# Patient Record
Sex: Male | Born: 1950 | Race: White | Hispanic: No | Marital: Married | Smoking: Former smoker
Health system: Southern US, Community
[De-identification: ages and names within clinical notes are randomized; demographics above are authoritative.]

## PROBLEM LIST (undated history)

## (undated) DIAGNOSIS — J189 Pneumonia, unspecified organism: Secondary | ICD-10-CM

## (undated) DIAGNOSIS — Z87891 Personal history of nicotine dependence: Secondary | ICD-10-CM

## (undated) DIAGNOSIS — K5792 Diverticulitis of intestine, part unspecified, without perforation or abscess without bleeding: Secondary | ICD-10-CM

## (undated) DIAGNOSIS — L03311 Cellulitis of abdominal wall: Secondary | ICD-10-CM

## (undated) DIAGNOSIS — M1990 Unspecified osteoarthritis, unspecified site: Secondary | ICD-10-CM

## (undated) DIAGNOSIS — J101 Influenza due to other identified influenza virus with other respiratory manifestations: Secondary | ICD-10-CM

## (undated) DIAGNOSIS — I10 Essential (primary) hypertension: Secondary | ICD-10-CM

## (undated) DIAGNOSIS — Z951 Presence of aortocoronary bypass graft: Secondary | ICD-10-CM

## (undated) DIAGNOSIS — IMO0002 Reserved for concepts with insufficient information to code with codable children: Secondary | ICD-10-CM

## (undated) DIAGNOSIS — T8859XA Other complications of anesthesia, initial encounter: Secondary | ICD-10-CM

## (undated) DIAGNOSIS — J449 Chronic obstructive pulmonary disease, unspecified: Secondary | ICD-10-CM

## (undated) DIAGNOSIS — T4145XA Adverse effect of unspecified anesthetic, initial encounter: Secondary | ICD-10-CM

## (undated) DIAGNOSIS — R229 Localized swelling, mass and lump, unspecified: Secondary | ICD-10-CM

## (undated) DIAGNOSIS — I2510 Atherosclerotic heart disease of native coronary artery without angina pectoris: Secondary | ICD-10-CM

## (undated) DIAGNOSIS — A4902 Methicillin resistant Staphylococcus aureus infection, unspecified site: Secondary | ICD-10-CM

## (undated) HISTORY — DX: Atherosclerotic heart disease of native coronary artery without angina pectoris: I25.10

## (undated) HISTORY — DX: Unspecified osteoarthritis, unspecified site: M19.90

## (undated) HISTORY — DX: Cellulitis of abdominal wall: L03.311

## (undated) HISTORY — PX: CORONARY ARTERY BYPASS GRAFT: SHX141

## (undated) HISTORY — DX: Localized swelling, mass and lump, unspecified: R22.9

## (undated) HISTORY — DX: Methicillin resistant Staphylococcus aureus infection, unspecified site: A49.02

## (undated) HISTORY — DX: Pneumonia, unspecified organism: J18.9

## (undated) HISTORY — PX: HERNIA REPAIR: SHX51

## (undated) HISTORY — PX: COLOSTOMY: SHX63

## (undated) HISTORY — PX: COLECTOMY: SHX59

## (undated) HISTORY — DX: Influenza due to other identified influenza virus with other respiratory manifestations: J10.1

## (undated) HISTORY — DX: Reserved for concepts with insufficient information to code with codable children: IMO0002

## (undated) HISTORY — DX: Diverticulitis of intestine, part unspecified, without perforation or abscess without bleeding: K57.92

## (undated) HISTORY — PX: COLOSTOMY TAKEDOWN: SHX5258

---

## 2002-02-18 ENCOUNTER — Inpatient Hospital Stay (HOSPITAL_COMMUNITY): Admission: EM | Admit: 2002-02-18 | Discharge: 2002-02-24 | Payer: Self-pay | Admitting: Emergency Medicine

## 2002-02-18 ENCOUNTER — Encounter: Payer: Self-pay | Admitting: Cardiothoracic Surgery

## 2002-02-18 ENCOUNTER — Encounter: Payer: Self-pay | Admitting: Emergency Medicine

## 2002-02-18 ENCOUNTER — Encounter: Payer: Self-pay | Admitting: Cardiovascular Disease

## 2002-02-19 ENCOUNTER — Encounter: Payer: Self-pay | Admitting: Cardiothoracic Surgery

## 2002-02-20 ENCOUNTER — Encounter: Payer: Self-pay | Admitting: Cardiothoracic Surgery

## 2002-02-21 ENCOUNTER — Encounter: Payer: Self-pay | Admitting: Cardiothoracic Surgery

## 2002-02-22 ENCOUNTER — Encounter: Payer: Self-pay | Admitting: Cardiothoracic Surgery

## 2002-02-23 ENCOUNTER — Encounter: Payer: Self-pay | Admitting: Cardiothoracic Surgery

## 2002-03-18 ENCOUNTER — Encounter: Payer: Self-pay | Admitting: Cardiothoracic Surgery

## 2002-03-18 ENCOUNTER — Encounter: Admission: RE | Admit: 2002-03-18 | Discharge: 2002-03-18 | Payer: Self-pay | Admitting: Cardiothoracic Surgery

## 2002-09-22 ENCOUNTER — Encounter: Payer: Self-pay | Admitting: General Surgery

## 2002-09-22 ENCOUNTER — Inpatient Hospital Stay (HOSPITAL_COMMUNITY): Admission: EM | Admit: 2002-09-22 | Discharge: 2002-10-01 | Payer: Self-pay | Admitting: Emergency Medicine

## 2002-09-22 ENCOUNTER — Encounter: Payer: Self-pay | Admitting: Emergency Medicine

## 2002-09-23 ENCOUNTER — Encounter (INDEPENDENT_AMBULATORY_CARE_PROVIDER_SITE_OTHER): Payer: Self-pay | Admitting: *Deleted

## 2002-09-23 ENCOUNTER — Encounter: Payer: Self-pay | Admitting: Cardiovascular Disease

## 2002-12-03 ENCOUNTER — Encounter: Admission: RE | Admit: 2002-12-03 | Discharge: 2002-12-03 | Payer: Self-pay | Admitting: General Surgery

## 2003-01-10 ENCOUNTER — Inpatient Hospital Stay (HOSPITAL_COMMUNITY): Admission: RE | Admit: 2003-01-10 | Discharge: 2003-01-17 | Payer: Self-pay | Admitting: General Surgery

## 2003-01-10 ENCOUNTER — Encounter (INDEPENDENT_AMBULATORY_CARE_PROVIDER_SITE_OTHER): Payer: Self-pay

## 2004-02-06 ENCOUNTER — Ambulatory Visit: Payer: Self-pay | Admitting: Family Medicine

## 2004-02-09 ENCOUNTER — Ambulatory Visit: Payer: Self-pay | Admitting: Family Medicine

## 2004-02-16 ENCOUNTER — Ambulatory Visit: Payer: Self-pay | Admitting: Family Medicine

## 2004-05-15 ENCOUNTER — Ambulatory Visit: Payer: Self-pay | Admitting: Family Medicine

## 2004-07-25 ENCOUNTER — Ambulatory Visit: Payer: Self-pay | Admitting: Family Medicine

## 2004-09-12 ENCOUNTER — Inpatient Hospital Stay (HOSPITAL_COMMUNITY): Admission: RE | Admit: 2004-09-12 | Discharge: 2004-09-25 | Payer: Self-pay | Admitting: General Surgery

## 2004-11-26 ENCOUNTER — Ambulatory Visit: Payer: Self-pay | Admitting: Family Medicine

## 2005-03-26 ENCOUNTER — Ambulatory Visit: Payer: Self-pay | Admitting: Family Medicine

## 2005-09-09 ENCOUNTER — Encounter: Admission: RE | Admit: 2005-09-09 | Discharge: 2005-09-09 | Payer: Self-pay | Admitting: General Surgery

## 2005-09-25 ENCOUNTER — Ambulatory Visit: Payer: Self-pay | Admitting: Family Medicine

## 2006-01-20 ENCOUNTER — Ambulatory Visit: Payer: Self-pay | Admitting: Family Medicine

## 2006-05-28 ENCOUNTER — Ambulatory Visit: Payer: Self-pay | Admitting: Family Medicine

## 2006-06-18 ENCOUNTER — Ambulatory Visit: Payer: Self-pay | Admitting: Family Medicine

## 2006-12-08 ENCOUNTER — Encounter: Admission: RE | Admit: 2006-12-08 | Discharge: 2006-12-08 | Payer: Self-pay | Admitting: General Surgery

## 2006-12-10 ENCOUNTER — Ambulatory Visit (HOSPITAL_COMMUNITY): Admission: RE | Admit: 2006-12-10 | Discharge: 2006-12-10 | Payer: Self-pay | Admitting: General Surgery

## 2006-12-19 ENCOUNTER — Ambulatory Visit (HOSPITAL_COMMUNITY): Admission: RE | Admit: 2006-12-19 | Discharge: 2006-12-19 | Payer: Self-pay | Admitting: General Surgery

## 2006-12-20 ENCOUNTER — Ambulatory Visit (HOSPITAL_COMMUNITY): Admission: RE | Admit: 2006-12-20 | Discharge: 2006-12-20 | Payer: Self-pay | Admitting: General Surgery

## 2007-09-24 ENCOUNTER — Encounter: Admission: RE | Admit: 2007-09-24 | Discharge: 2007-09-24 | Payer: Self-pay | Admitting: General Surgery

## 2007-10-01 ENCOUNTER — Ambulatory Visit (HOSPITAL_COMMUNITY): Admission: RE | Admit: 2007-10-01 | Discharge: 2007-10-01 | Payer: Self-pay | Admitting: General Surgery

## 2007-10-07 ENCOUNTER — Encounter: Admission: RE | Admit: 2007-10-07 | Discharge: 2007-10-07 | Payer: Self-pay | Admitting: Interventional Radiology

## 2008-04-27 ENCOUNTER — Encounter: Admission: RE | Admit: 2008-04-27 | Discharge: 2008-04-27 | Payer: Self-pay | Admitting: General Surgery

## 2008-04-29 ENCOUNTER — Inpatient Hospital Stay (HOSPITAL_COMMUNITY): Admission: AD | Admit: 2008-04-29 | Discharge: 2008-06-10 | Payer: Self-pay | Admitting: General Surgery

## 2008-04-29 ENCOUNTER — Ambulatory Visit: Payer: Self-pay | Admitting: Internal Medicine

## 2008-05-14 HISTORY — PX: BOWEL RESECTION: SHX1257

## 2008-05-25 ENCOUNTER — Ambulatory Visit: Payer: Self-pay | Admitting: Infectious Diseases

## 2008-05-26 ENCOUNTER — Encounter (INDEPENDENT_AMBULATORY_CARE_PROVIDER_SITE_OTHER): Payer: Self-pay | Admitting: General Surgery

## 2008-05-26 ENCOUNTER — Ambulatory Visit: Payer: Self-pay | Admitting: Vascular Surgery

## 2008-06-03 ENCOUNTER — Encounter (INDEPENDENT_AMBULATORY_CARE_PROVIDER_SITE_OTHER): Payer: Self-pay | Admitting: General Surgery

## 2008-09-06 ENCOUNTER — Encounter: Admission: RE | Admit: 2008-09-06 | Discharge: 2008-09-06 | Payer: Self-pay | Admitting: General Surgery

## 2008-11-04 ENCOUNTER — Emergency Department (HOSPITAL_COMMUNITY): Admission: EM | Admit: 2008-11-04 | Discharge: 2008-11-04 | Payer: Self-pay | Admitting: Emergency Medicine

## 2008-11-04 ENCOUNTER — Encounter: Admission: RE | Admit: 2008-11-04 | Discharge: 2008-11-04 | Payer: Self-pay | Admitting: Surgery

## 2009-03-16 HISTORY — PX: ENTEROCUTANEOUS FISTULA CLOSURE: SHX1510

## 2009-06-29 DIAGNOSIS — J189 Pneumonia, unspecified organism: Secondary | ICD-10-CM

## 2009-06-29 HISTORY — DX: Pneumonia, unspecified organism: J18.9

## 2010-03-13 ENCOUNTER — Ambulatory Visit (HOSPITAL_COMMUNITY)
Admission: RE | Admit: 2010-03-13 | Discharge: 2010-03-13 | Disposition: A | Discharge status: Home / Self Care | Payer: BC Managed Care – PPO | Source: Ambulatory Visit | Attending: Family Medicine | Admitting: Family Medicine

## 2010-03-13 DIAGNOSIS — I252 Old myocardial infarction: Secondary | ICD-10-CM | POA: Insufficient documentation

## 2010-03-13 DIAGNOSIS — I10 Essential (primary) hypertension: Secondary | ICD-10-CM | POA: Insufficient documentation

## 2010-03-13 DIAGNOSIS — R609 Edema, unspecified: Secondary | ICD-10-CM

## 2010-03-13 DIAGNOSIS — M7989 Other specified soft tissue disorders: Secondary | ICD-10-CM | POA: Insufficient documentation

## 2010-03-13 DIAGNOSIS — I2510 Atherosclerotic heart disease of native coronary artery without angina pectoris: Secondary | ICD-10-CM | POA: Insufficient documentation

## 2010-04-04 IMAGING — CT CT PELVIS W/O CM
2 of 4 series · 14 of 32 positions shown, 19 images · non-contrast
Comparison: 04/27/2008

 CT ABDOMEN

May 02, 2008 –DUPLICATE COPY for exam association in [facility]. No change from original report.
CLINICAL DATA: Abdominal wall abscess and sepsis

 CT ABDOMEN AND PELVIS WITHOUT CONTRAST
TECHNIQUE: Multidetector CT imaging of the abdomen and pelvis was
 performed following the standard protocol without intravenous
 contrast.

[Series 2: abd pelvis · axial · 0.92mm/px · z∈[-460,-80]mm · 7 of 102 slices shown, 12 images]
[im 13/102  soft-tissue]
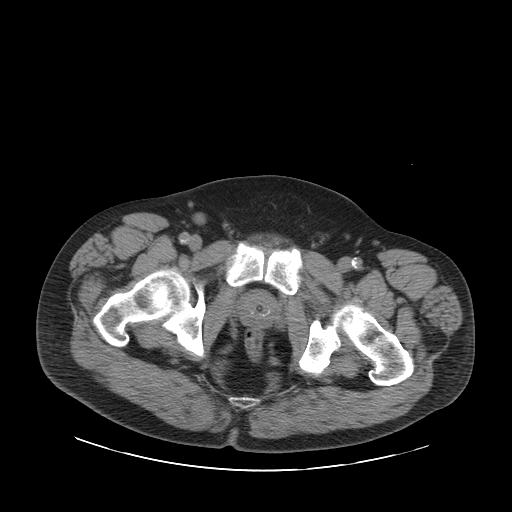
[im 13/102  bone]
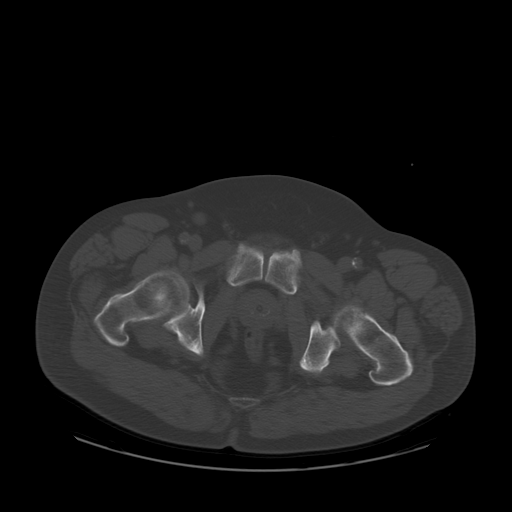
[im 26/102  soft-tissue]
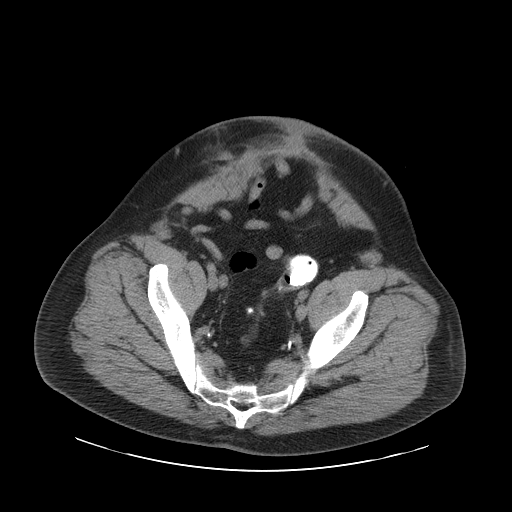
[im 38/102  soft-tissue]
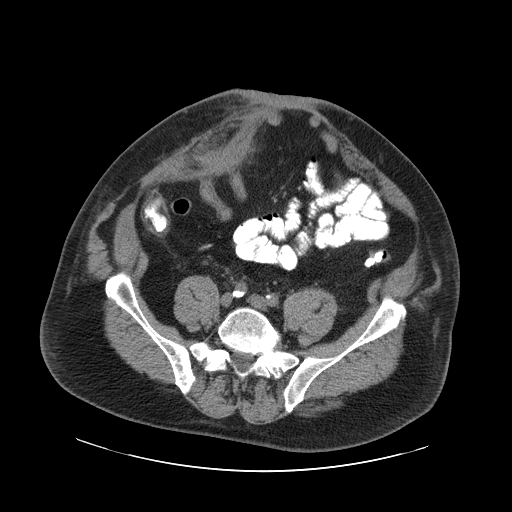
[im 51/102  soft-tissue]
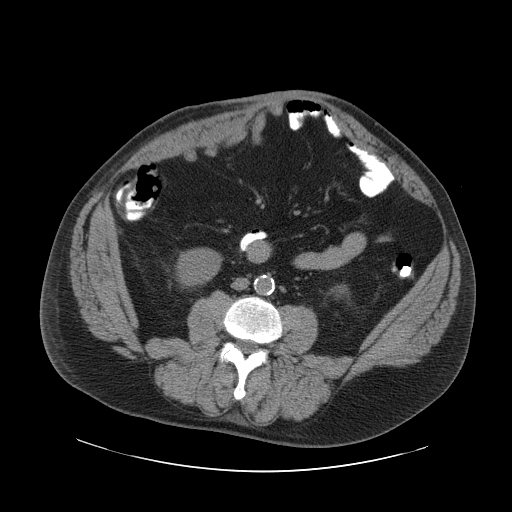
[im 51/102  lung]
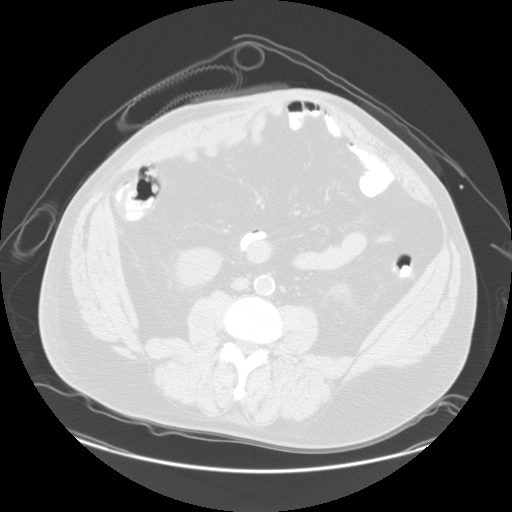
[im 64/102  soft-tissue]
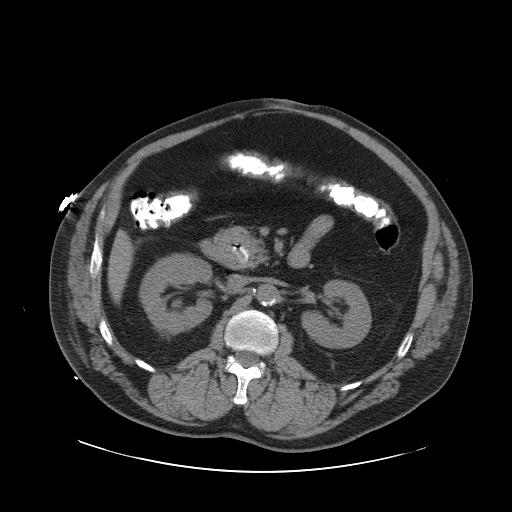
[im 64/102  lung]
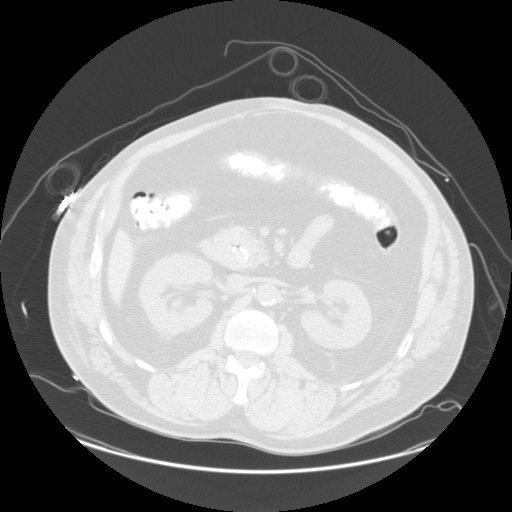
[im 76/102  soft-tissue]
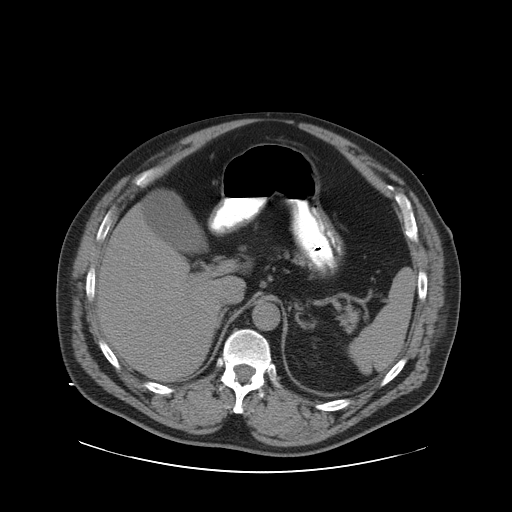
[im 76/102  lung]
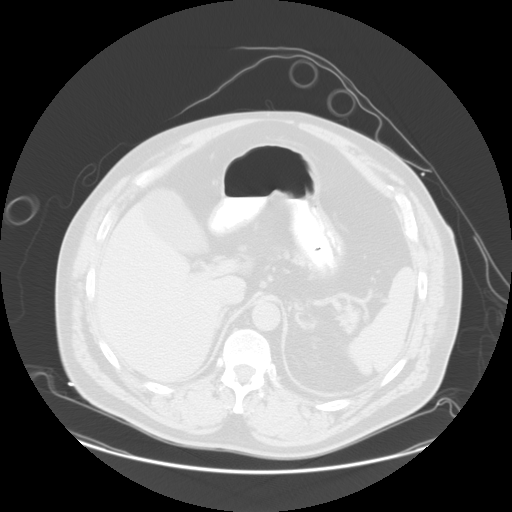
[im 89/102  soft-tissue]
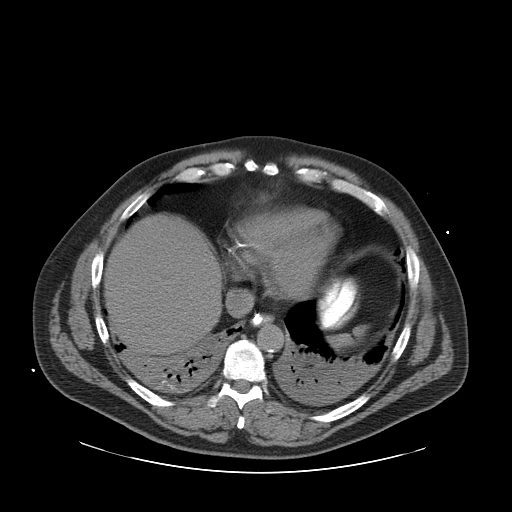
[im 89/102  lung]
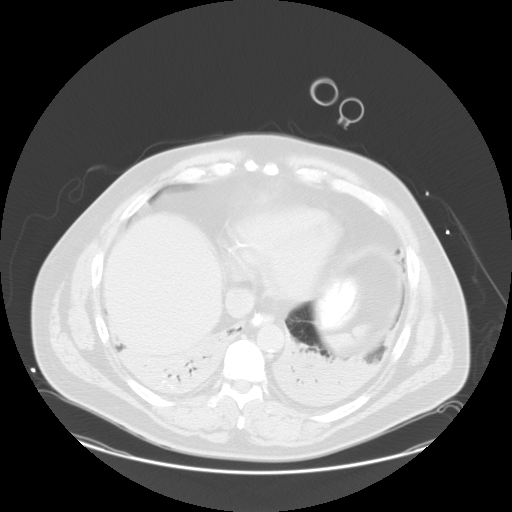

[Series 400: sag · sagittal · 1.00mm/px · 7 of 139 slices shown]
[im 13/139  soft-tissue]
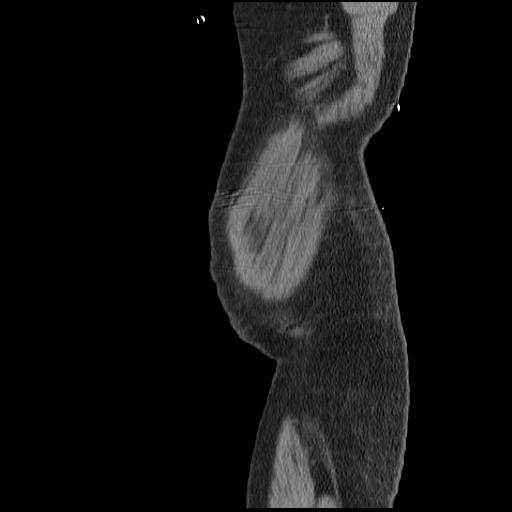
[im 26/139  soft-tissue]
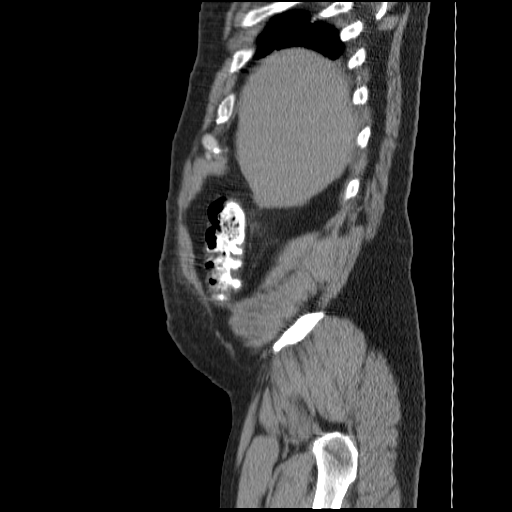
[im 51/139  soft-tissue]
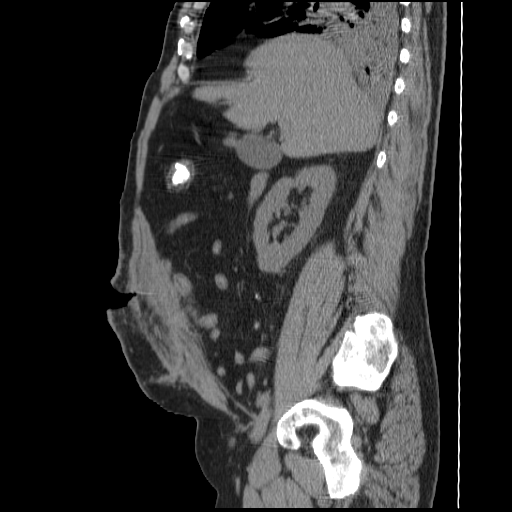
[im 63/139  soft-tissue]
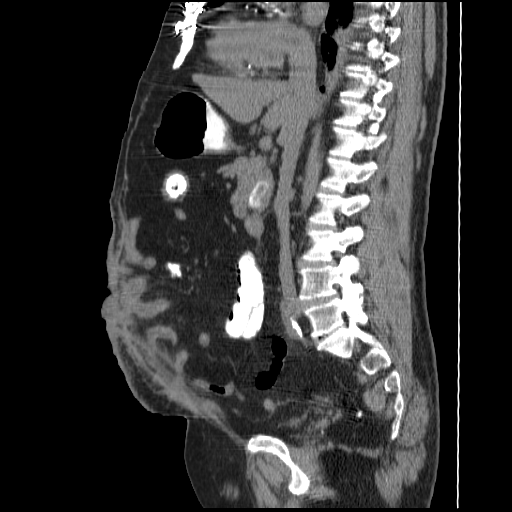
[im 76/139  soft-tissue]
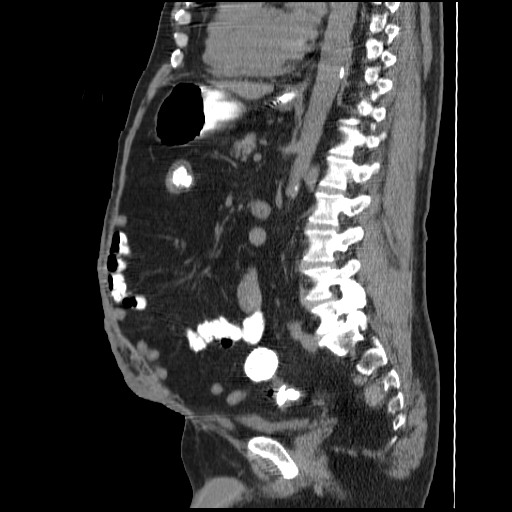
[im 88/139  soft-tissue]
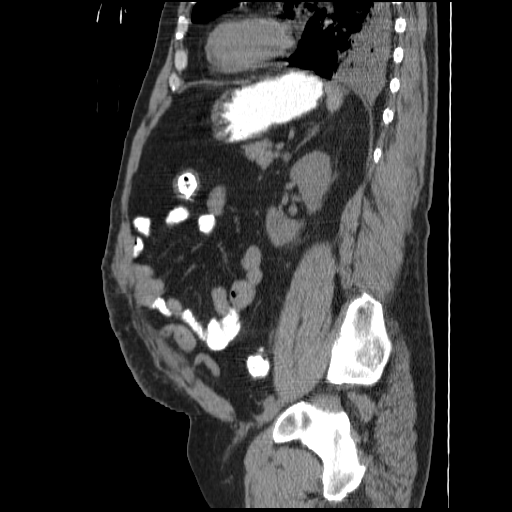
[im 113/139  soft-tissue]
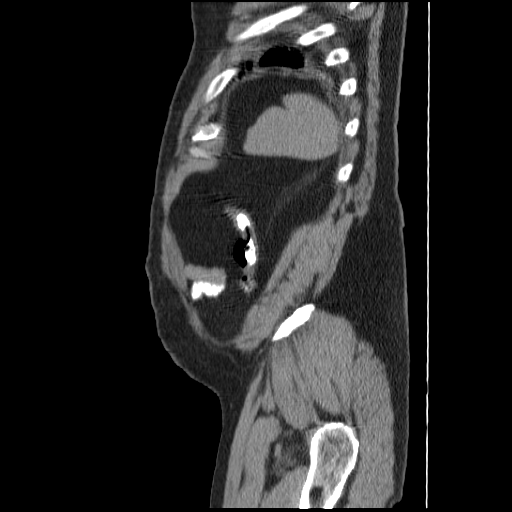

[14 of 32 positions shown; findings below may reference images not displayed]

FINDINGS: Extensive bilateral airspace disease has developed at
 both lung bases and ARDS pattern. High density is present at the
 right lung base worrisome for minimal aspiration of contrast.

 Postoperative changes in the right anterior lower abdominal wall
 are now present. The superficial fluid collection has been
 resolved. The fluid collection just deep to the anterior abdominal
 wall has been nearly completely decompressed with minimal residual
 fluid. In the abdomen, there is no free intraperitoneal air. No
 free fluid. Normal appearing small bowel loops are visualized.
 Density in the region of the pancreatic head is likely due to a
 contrast filled duodenal diverticulum.

 The gallbladder, liver, spleen, adrenal glands, and kidneys are
 stable in appearance.
IMPRESSION: Postoperative changes in the right anterior abdominal wall with
 decompression of the to abscess sees as described. The deeper
 abscess is almost completely decompressed.

 No free intraperitoneal air or free fluid in the abdomen to suggest
 bowel injury.

 Bilateral airspace disease in an ARDS pattern with severity.

 CT PELVIS
FINDINGS: No free fluid. Foley catheter decompresses the bladder.
 Normal appendix.
IMPRESSION: No acute intrapelvic pathology.

## 2010-04-06 IMAGING — CR DG CHEST 1V PORT
1 series · 1 of 1 positions shown · non-contrast
Comparison: Chest 04/30/2008.

CLINICAL DATA: Abdominal wall abscess.

PORTABLE CHEST - 1 VIEW

[view not recorded]
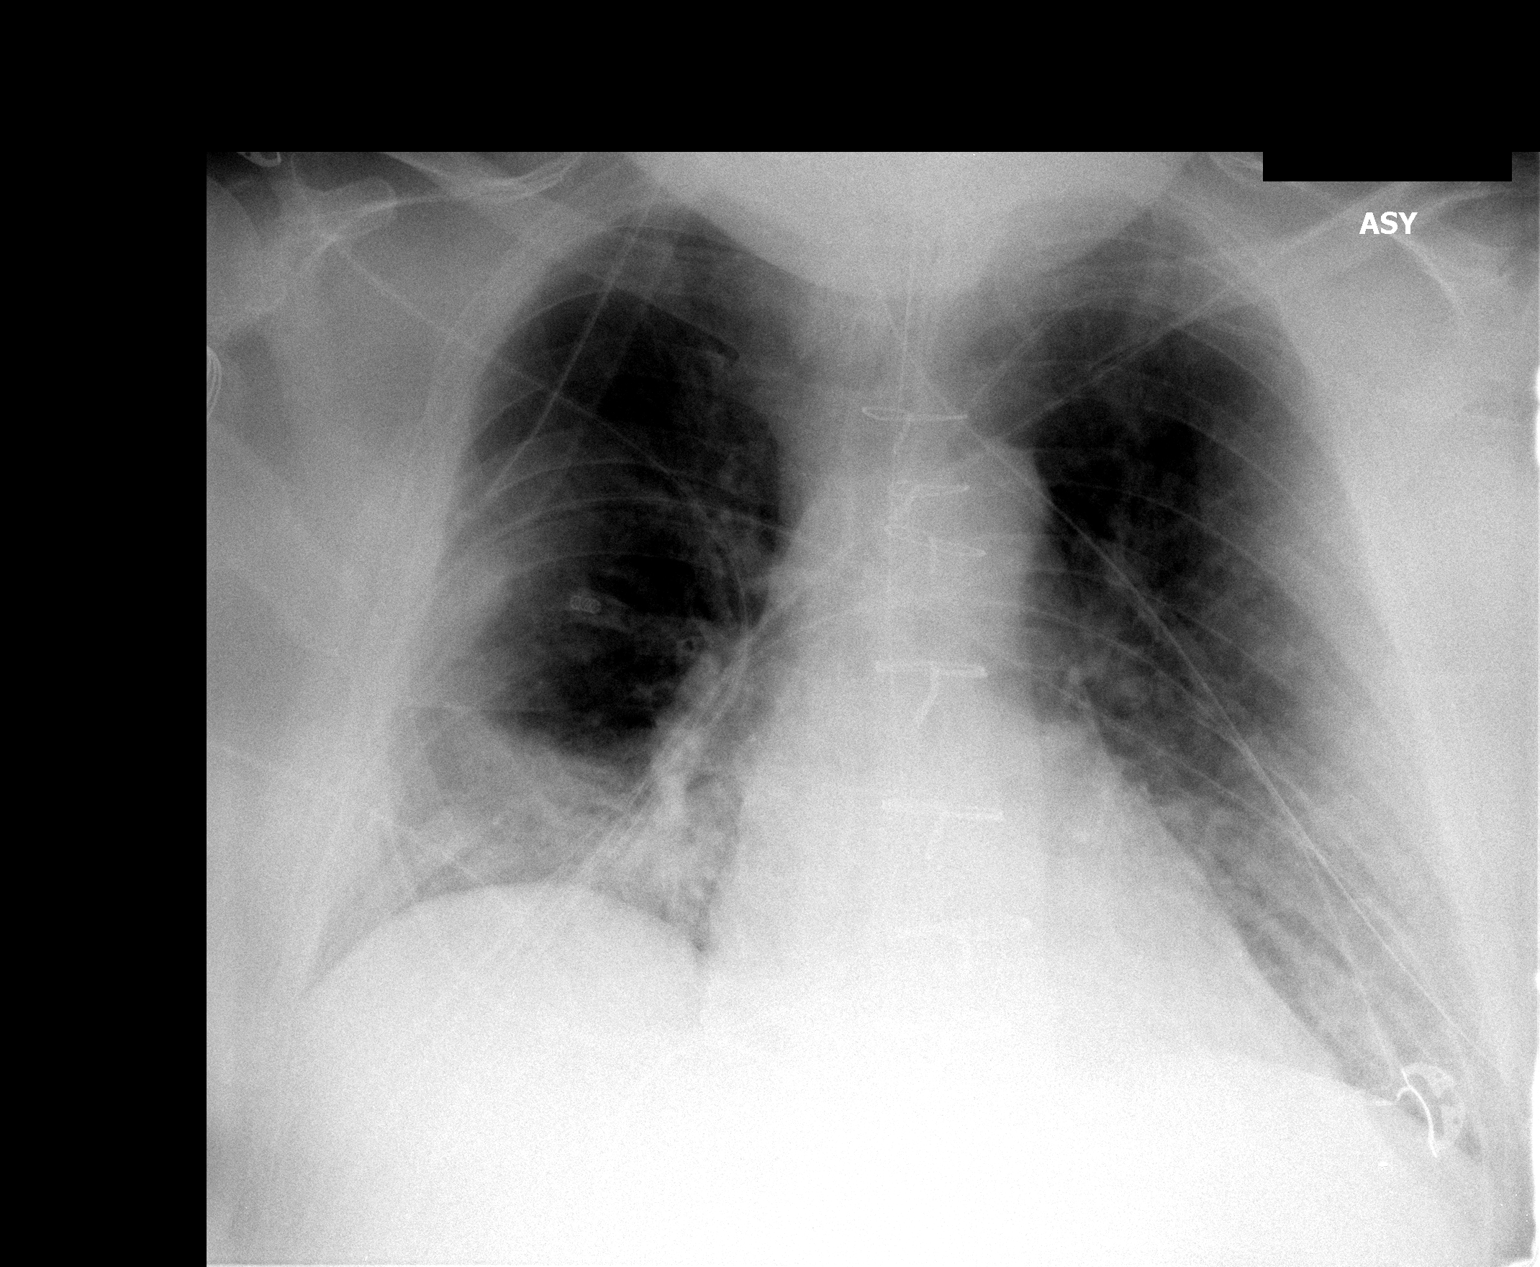

[1 of 1 positions shown; findings below may reference images not displayed]

FINDINGS: Support tubes and lines are unchanged.  There has been
interval improvement in extensive bilateral airspace disease.
Residual airspace opacity is most notable in the right lung base.
No pleural effusion.  Heart size normal.
IMPRESSION: Marked improvement in bilateral airspace disease.

## 2010-04-07 IMAGING — CR DG CHEST 1V PORT
1 series · 1 of 1 positions shown · non-contrast
Comparison: 05/02/2008

CLINICAL DATA: ARDS.  Abdominal wall abscess.

PORTABLE CHEST - 1 VIEW

[AP]
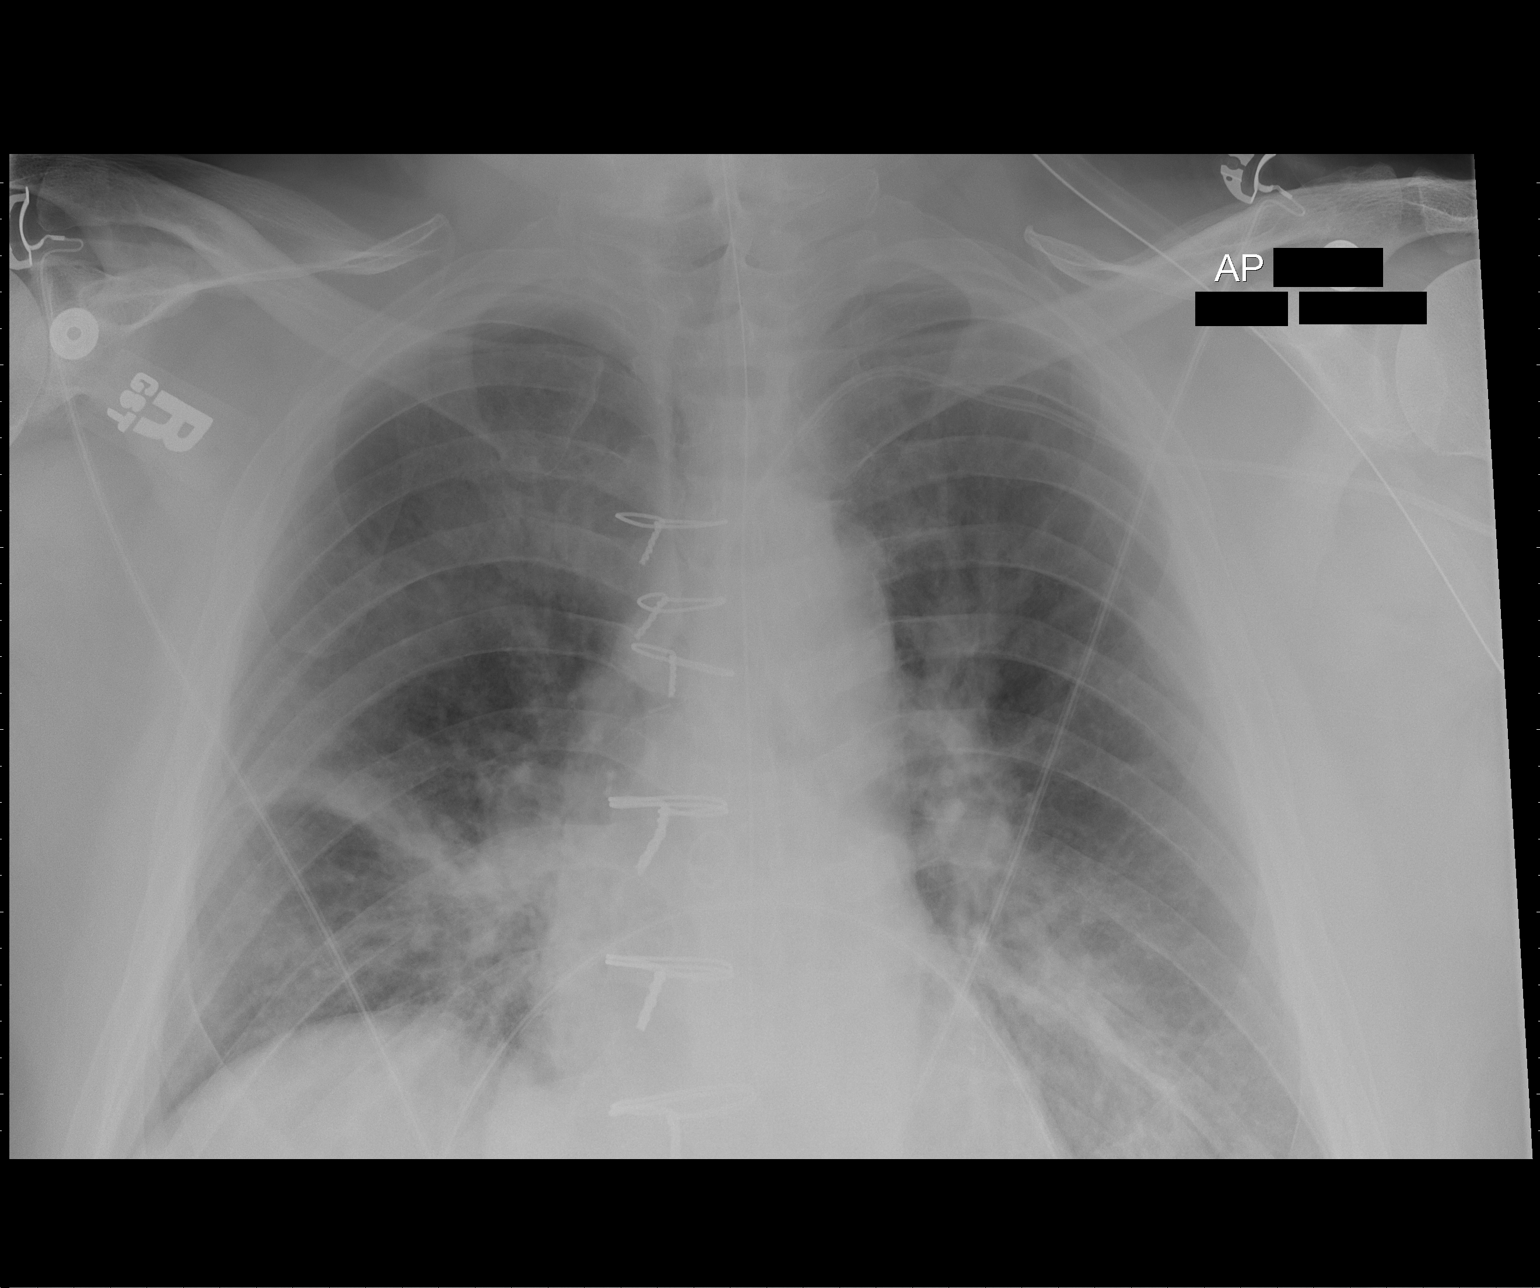

[1 of 1 positions shown; findings below may reference images not displayed]

FINDINGS: Cardiomegaly, evidence of CABG, an NG tube with tip off
the field of view, and left subclavian central venous catheter are
stable.
Bilateral lower lung atelectasis/airspace disease again noted with
slight improved aeration.
Mild pulmonary vascular congestion is again noted.
There is no evidence of pneumothorax.
IMPRESSION: Improved basilar aeration otherwise stable chest.

## 2010-04-08 IMAGING — CR DG CHEST 1V PORT
1 series · 1 of 1 positions shown · non-contrast
Comparison: Chest 05/03/2008.

CLINICAL DATA: Abdominal wall abscess.

PORTABLE CHEST - 1 VIEW

[AP]
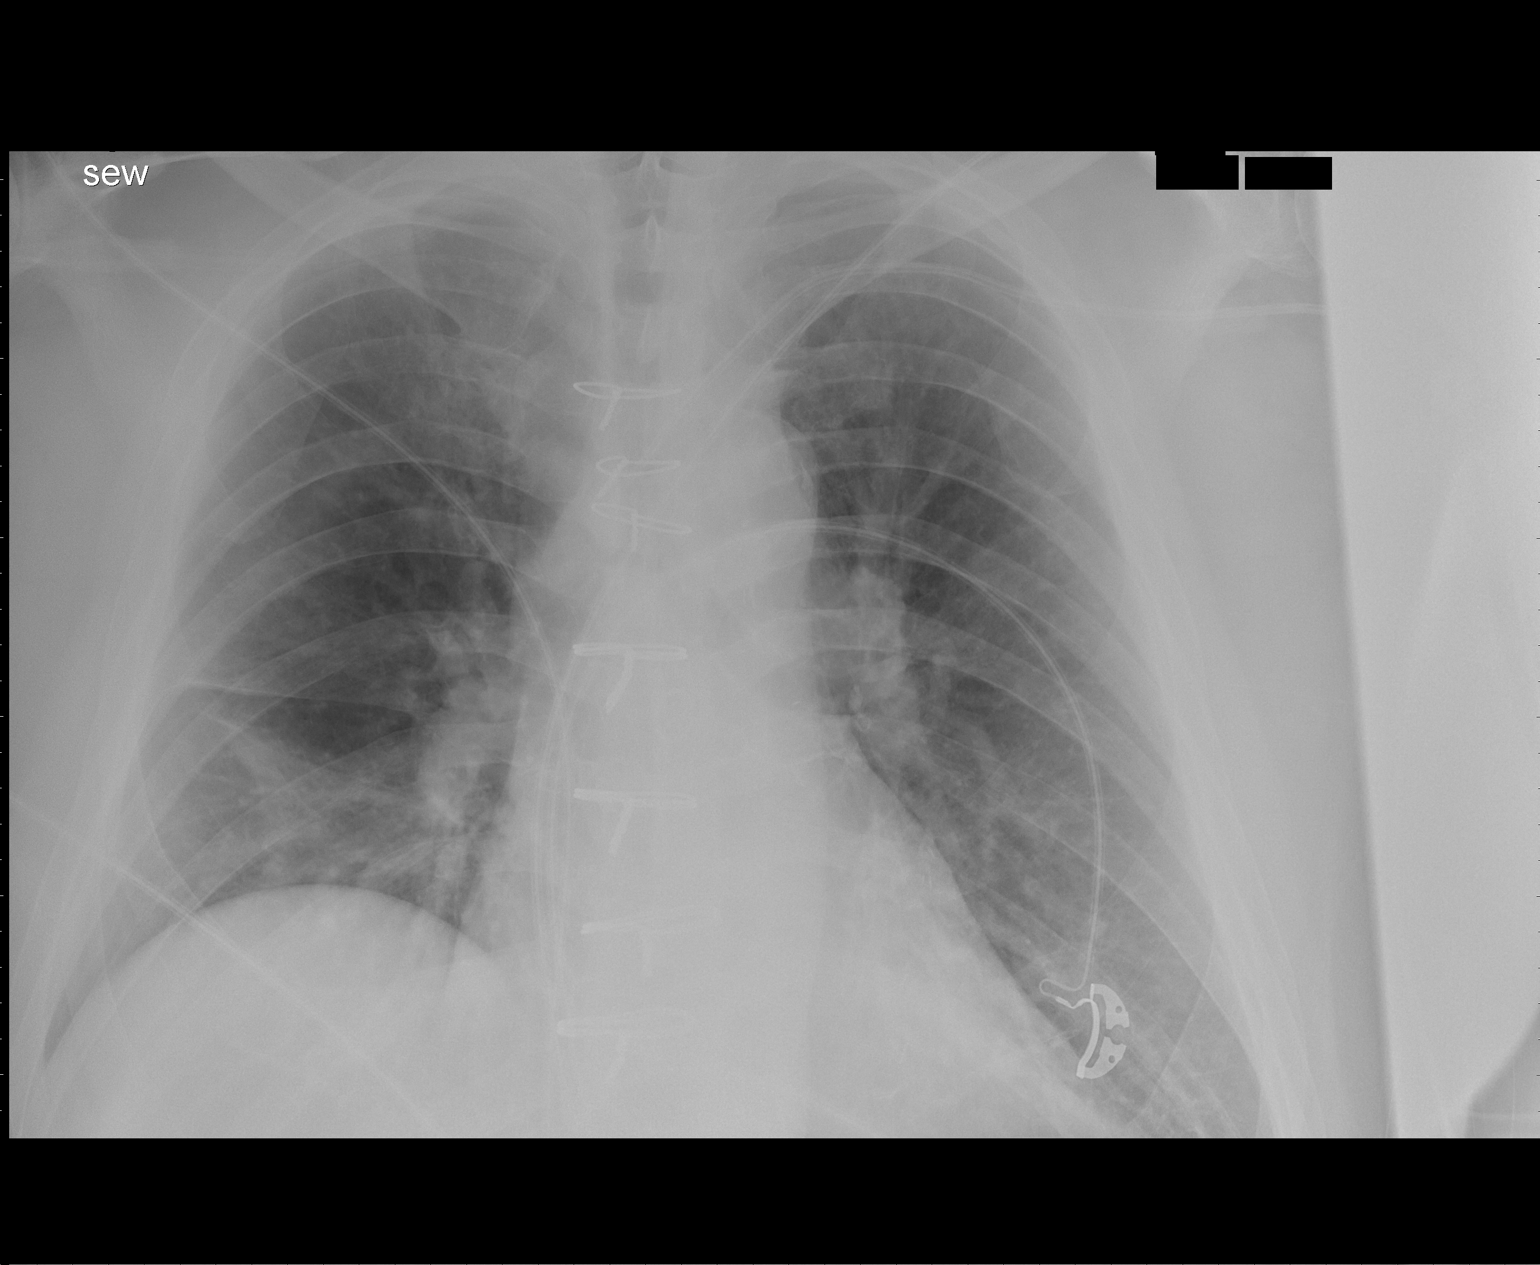

[1 of 1 positions shown; findings below may reference images not displayed]

FINDINGS: There has been continued improvement in aeration
bilaterally consistent with decreasing atelectasis.  The patient's
NG tube has been removed.  Left subclavian catheter remains in
place.  Heart size normal.
IMPRESSION: Improved aeration bilaterally.

## 2010-04-24 LAB — BASIC METABOLIC PANEL
BUN: 4 mg/dL — ABNORMAL LOW (ref 6–23)
BUN: 15 mg/dL (ref 6–23)
BUN: 17 mg/dL (ref 6–23)
CO2: 33 mEq/L — ABNORMAL HIGH (ref 19–32)
CO2: 28 mEq/L (ref 19–32)
Calcium: 7.8 mg/dL — ABNORMAL LOW (ref 8.4–10.5)
Calcium: 8.2 mg/dL — ABNORMAL LOW (ref 8.4–10.5)
Calcium: 8.3 mg/dL — ABNORMAL LOW (ref 8.4–10.5)
Chloride: 102 mEq/L (ref 96–112)
Chloride: 102 mEq/L (ref 96–112)
Chloride: 102 mEq/L (ref 96–112)
Creatinine, Ser: 0.82 mg/dL (ref 0.4–1.5)
Creatinine, Ser: 0.94 mg/dL (ref 0.4–1.5)
Creatinine, Ser: 0.96 mg/dL (ref 0.4–1.5)
Creatinine, Ser: 1 mg/dL (ref 0.4–1.5)
GFR calc Af Amer: >60 mL/min (ref >60)
GFR calc Af Amer: >60 mL/min (ref >60)
GFR calc Af Amer: >60 mL/min (ref >60)
GFR calc non Af Amer: >60 mL/min (ref >60)
GFR calc non Af Amer: >60 mL/min (ref >60)
GFR calc non Af Amer: >60 mL/min (ref >60)
GFR calc non Af Amer: >60 mL/min (ref >60)
GFR calc non Af Amer: >60 mL/min (ref >60)
Glucose, Bld: 127 mg/dL — ABNORMAL HIGH (ref 70–99)
Glucose, Bld: 188 mg/dL — ABNORMAL HIGH (ref 70–99)
Glucose, Bld: 227 mg/dL — ABNORMAL HIGH (ref 70–99)
Glucose, Bld: 229 mg/dL — ABNORMAL HIGH (ref 70–99)
Potassium: 4.1 mEq/L (ref 3.5–5.1)
Sodium: 134 mEq/L — ABNORMAL LOW (ref 135–145)

## 2010-04-24 LAB — COMPREHENSIVE METABOLIC PANEL
ALT: 55 U/L — ABNORMAL HIGH (ref 0–53)
ALT: 36 U/L (ref 0–53)
AST: 25 U/L (ref 0–37)
AST: 27 U/L (ref 0–37)
Albumin: 2.7 g/dL — ABNORMAL LOW (ref 3.5–5.2)
Albumin: 2.8 g/dL — ABNORMAL LOW (ref 3.5–5.2)
Albumin: 2.3 g/dL — ABNORMAL LOW (ref 3.5–5.2)
Albumin: 2.6 g/dL — ABNORMAL LOW (ref 3.5–5.2)
Albumin: 2.8 g/dL — ABNORMAL LOW (ref 3.5–5.2)
Albumin: 2.9 g/dL — ABNORMAL LOW (ref 3.5–5.2)
Albumin: 3 g/dL — ABNORMAL LOW (ref 3.5–5.2)
Albumin: 3 g/dL — ABNORMAL LOW (ref 3.5–5.2)
Alkaline Phosphatase: 101 U/L (ref 39–117)
Alkaline Phosphatase: 66 U/L (ref 39–117)
Alkaline Phosphatase: 71 U/L (ref 39–117)
Alkaline Phosphatase: 77 U/L (ref 39–117)
Alkaline Phosphatase: 78 U/L (ref 39–117)
Alkaline Phosphatase: 80 U/L (ref 39–117)
BUN: 15 mg/dL (ref 6–23)
BUN: 15 mg/dL (ref 6–23)
BUN: 19 mg/dL (ref 6–23)
BUN: 21 mg/dL (ref 6–23)
CO2: 29 mEq/L (ref 19–32)
Calcium: 8.5 mg/dL (ref 8.4–10.5)
Calcium: 8.7 mg/dL (ref 8.4–10.5)
Calcium: 8.5 mg/dL (ref 8.4–10.5)
Chloride: 100 mEq/L (ref 96–112)
Chloride: 102 mEq/L (ref 96–112)
Chloride: 103 mEq/L (ref 96–112)
Creatinine, Ser: 0.96 mg/dL (ref 0.4–1.5)
Creatinine, Ser: 0.94 mg/dL (ref 0.4–1.5)
Creatinine, Ser: 0.95 mg/dL (ref 0.4–1.5)
Creatinine, Ser: 0.98 mg/dL (ref 0.4–1.5)
GFR calc Af Amer: >60 mL/min (ref >60)
GFR calc Af Amer: >60 mL/min (ref >60)
GFR calc non Af Amer: >60 mL/min (ref >60)
Glucose, Bld: 100 mg/dL — ABNORMAL HIGH (ref 70–99)
Glucose, Bld: 104 mg/dL — ABNORMAL HIGH (ref 70–99)
Glucose, Bld: 115 mg/dL — ABNORMAL HIGH (ref 70–99)
Glucose, Bld: 291 mg/dL — ABNORMAL HIGH (ref 70–99)
Potassium: 3.9 mEq/L (ref 3.5–5.1)
Potassium: 3.9 mEq/L (ref 3.5–5.1)
Potassium: 4 mEq/L (ref 3.5–5.1)
Potassium: 4.1 mEq/L (ref 3.5–5.1)
Potassium: 4.8 mEq/L (ref 3.5–5.1)
Sodium: 141 mEq/L (ref 135–145)
Sodium: 139 mEq/L (ref 135–145)
Sodium: 140 mEq/L (ref 135–145)
Total Bilirubin: 0.4 mg/dL (ref 0.3–1.2)
Total Bilirubin: 0.4 mg/dL (ref 0.3–1.2)
Total Bilirubin: 0.5 mg/dL (ref 0.3–1.2)
Total Protein: 7.1 g/dL (ref 6.0–8.3)
Total Protein: 7.3 g/dL (ref 6.0–8.3)
Total Protein: 7.1 g/dL (ref 6.0–8.3)
Total Protein: 7.3 g/dL (ref 6.0–8.3)
Total Protein: 7.5 g/dL (ref 6.0–8.3)
Total Protein: 7.6 g/dL (ref 6.0–8.3)

## 2010-04-24 LAB — GLUCOSE, CAPILLARY
Glucose-Capillary: 111 mg/dL — ABNORMAL HIGH (ref 70–99)
Glucose-Capillary: 112 mg/dL — ABNORMAL HIGH (ref 70–99)
Glucose-Capillary: 112 mg/dL — ABNORMAL HIGH (ref 70–99)
Glucose-Capillary: 115 mg/dL — ABNORMAL HIGH (ref 70–99)
Glucose-Capillary: 123 mg/dL — ABNORMAL HIGH (ref 70–99)
Glucose-Capillary: 126 mg/dL — ABNORMAL HIGH (ref 70–99)
Glucose-Capillary: 137 mg/dL — ABNORMAL HIGH (ref 70–99)
Glucose-Capillary: 141 mg/dL — ABNORMAL HIGH (ref 70–99)
Glucose-Capillary: 151 mg/dL — ABNORMAL HIGH (ref 70–99)
Glucose-Capillary: 154 mg/dL — ABNORMAL HIGH (ref 70–99)
Glucose-Capillary: 109 mg/dL — ABNORMAL HIGH (ref 70–99)
Glucose-Capillary: 113 mg/dL — ABNORMAL HIGH (ref 70–99)
Glucose-Capillary: 119 mg/dL — ABNORMAL HIGH (ref 70–99)
Glucose-Capillary: 123 mg/dL — ABNORMAL HIGH (ref 70–99)
Glucose-Capillary: 124 mg/dL — ABNORMAL HIGH (ref 70–99)
Glucose-Capillary: 127 mg/dL — ABNORMAL HIGH (ref 70–99)
Glucose-Capillary: 128 mg/dL — ABNORMAL HIGH (ref 70–99)
Glucose-Capillary: 128 mg/dL — ABNORMAL HIGH (ref 70–99)
Glucose-Capillary: 129 mg/dL — ABNORMAL HIGH (ref 70–99)
Glucose-Capillary: 130 mg/dL — ABNORMAL HIGH (ref 70–99)
Glucose-Capillary: 144 mg/dL — ABNORMAL HIGH (ref 70–99)
Glucose-Capillary: 176 mg/dL — ABNORMAL HIGH (ref 70–99)
Glucose-Capillary: 202 mg/dL — ABNORMAL HIGH (ref 70–99)
Glucose-Capillary: 203 mg/dL — ABNORMAL HIGH (ref 70–99)
Glucose-Capillary: 90 mg/dL (ref 70–99)

## 2010-04-24 LAB — CLOSTRIDIUM DIFFICILE EIA: C difficile Toxins A+B, EIA: NEGATIVE

## 2010-04-24 LAB — CBC
HCT: 35.1 % — ABNORMAL LOW (ref 39.0–52.0)
HCT: 35.8 % — ABNORMAL LOW (ref 39.0–52.0)
HCT: 36.9 % — ABNORMAL LOW (ref 39.0–52.0)
HCT: 37.2 % — ABNORMAL LOW (ref 39.0–52.0)
HCT: 39.1 % (ref 39.0–52.0)
HCT: 39.9 % (ref 39.0–52.0)
Hemoglobin: 12.2 g/dL — ABNORMAL LOW (ref 13.0–17.0)
Hemoglobin: 12.4 g/dL — ABNORMAL LOW (ref 13.0–17.0)
Hemoglobin: 12.5 g/dL — ABNORMAL LOW (ref 13.0–17.0)
Hemoglobin: 13 g/dL (ref 13.0–17.0)
Hemoglobin: 13.5 g/dL (ref 13.0–17.0)
Hemoglobin: 13.8 g/dL (ref 13.0–17.0)
MCHC: 33.9 g/dL (ref 30.0–36.0)
MCHC: 34.8 g/dL (ref 30.0–36.0)
MCHC: 34.5 g/dL (ref 30.0–36.0)
MCHC: 34.6 g/dL (ref 30.0–36.0)
MCHC: 34.7 g/dL (ref 30.0–36.0)
MCV: 88.4 fL (ref 78.0–100.0)
MCV: 90.3 fL (ref 78.0–100.0)
MCV: 88.6 fL (ref 78.0–100.0)
MCV: 88.6 fL (ref 78.0–100.0)
MCV: 88.9 fL (ref 78.0–100.0)
MCV: 89.1 fL (ref 78.0–100.0)
MCV: 89.1 fL (ref 78.0–100.0)
Platelets: 275 10*3/uL (ref 150–400)
Platelets: 363 10*3/uL (ref 150–400)
Platelets: 396 10*3/uL (ref 150–400)
Platelets: 115 10*3/uL — ABNORMAL LOW (ref 150–400)
Platelets: 168 10*3/uL (ref 150–400)
Platelets: 306 10*3/uL (ref 150–400)
RBC: 3.68 MIL/uL — ABNORMAL LOW (ref 4.22–5.81)
RBC: 4.03 MIL/uL — ABNORMAL LOW (ref 4.22–5.81)
RBC: 4.2 MIL/uL — ABNORMAL LOW (ref 4.22–5.81)
RBC: 4.39 MIL/uL (ref 4.22–5.81)
RDW: 13.7 % (ref 11.5–15.5)
RDW: 14.3 % (ref 11.5–15.5)
RDW: 13.4 % (ref 11.5–15.5)
RDW: 13.9 % (ref 11.5–15.5)
RDW: 14.1 % (ref 11.5–15.5)
WBC: 11.8 10*3/uL — ABNORMAL HIGH (ref 4.0–10.5)
WBC: 7.8 10*3/uL (ref 4.0–10.5)
WBC: 5 10*3/uL (ref 4.0–10.5)
WBC: 5.8 10*3/uL (ref 4.0–10.5)
WBC: 8.4 10*3/uL (ref 4.0–10.5)

## 2010-04-24 LAB — DIFFERENTIAL
Basophils Absolute: 0.1 10*3/uL (ref 0.0–0.1)
Basophils Absolute: 0 10*3/uL (ref 0.0–0.1)
Basophils Absolute: 0 10*3/uL (ref 0.0–0.1)
Basophils Absolute: 0.1 10*3/uL (ref 0.0–0.1)
Basophils Relative: 1 % (ref 0–1)
Basophils Relative: 1 % (ref 0–1)
Basophils Relative: 0 % (ref 0–1)
Basophils Relative: 2 % — ABNORMAL HIGH (ref 0–1)
Eosinophils Absolute: 0 10*3/uL (ref 0.0–0.7)
Eosinophils Absolute: 0.1 10*3/uL (ref 0.0–0.7)
Eosinophils Relative: 1 % (ref 0–5)
Lymphocytes Relative: 17 % (ref 12–46)
Lymphocytes Relative: 23 % (ref 12–46)
Lymphocytes Relative: 13 % (ref 12–46)
Lymphocytes Relative: 18 % (ref 12–46)
Lymphocytes Relative: 9 % — ABNORMAL LOW (ref 12–46)
Lymphs Abs: 1 10*3/uL (ref 0.7–4.0)
Lymphs Abs: 1.3 10*3/uL (ref 0.7–4.0)
Lymphs Abs: 1.8 10*3/uL (ref 0.7–4.0)
Lymphs Abs: 0.5 10*3/uL — ABNORMAL LOW (ref 0.7–4.0)
Monocytes Absolute: 0.7 10*3/uL (ref 0.1–1.0)
Monocytes Absolute: 0.4 10*3/uL (ref 0.1–1.0)
Monocytes Absolute: 0.8 10*3/uL (ref 0.1–1.0)
Monocytes Relative: 8 % (ref 3–12)
Monocytes Relative: 9 % (ref 3–12)
Monocytes Relative: 10 % (ref 3–12)
Neutro Abs: 11.8 10*3/uL — ABNORMAL HIGH (ref 1.7–7.7)
Neutro Abs: 4.9 10*3/uL (ref 1.7–7.7)
Neutro Abs: 5.6 10*3/uL (ref 1.7–7.7)
Neutro Abs: 3.8 10*3/uL (ref 1.7–7.7)
Neutro Abs: 5.4 10*3/uL (ref 1.7–7.7)
Neutrophils Relative %: 63 % (ref 43–77)
Neutrophils Relative %: 72 % (ref 43–77)
Neutrophils Relative %: 82 % — ABNORMAL HIGH (ref 43–77)
Neutrophils Relative %: 83 % — ABNORMAL HIGH (ref 43–77)
Neutrophils Relative %: 66 % (ref 43–77)
Neutrophils Relative %: 84 % — ABNORMAL HIGH (ref 43–77)

## 2010-04-24 LAB — PHOSPHORUS
Phosphorus: 5 mg/dL — ABNORMAL HIGH (ref 2.3–4.6)
Phosphorus: 5.1 mg/dL — ABNORMAL HIGH (ref 2.3–4.6)
Phosphorus: 2.1 mg/dL — ABNORMAL LOW (ref 2.3–4.6)
Phosphorus: 4.2 mg/dL (ref 2.3–4.6)
Phosphorus: 4.3 mg/dL (ref 2.3–4.6)

## 2010-04-24 LAB — URINALYSIS, MICROSCOPIC ONLY
Glucose, UA: NEGATIVE mg/dL
Ketones, ur: NEGATIVE mg/dL
Leukocytes, UA: NEGATIVE
Nitrite: NEGATIVE
Specific Gravity, Urine: 1.039 — ABNORMAL HIGH (ref 1.005–1.030)
pH: 5.5 (ref 5.0–8.0)

## 2010-04-24 LAB — CULTURE, BLOOD (ROUTINE X 2)
Culture: NO GROWTH
Culture: NO GROWTH

## 2010-04-24 LAB — MAGNESIUM
Magnesium: 2.5 mg/dL (ref 1.5–2.5)
Magnesium: 2.1 mg/dL (ref 1.5–2.5)

## 2010-04-24 LAB — PREALBUMIN
Prealbumin: 21.5 mg/dL (ref 18.0–45.0)
Prealbumin: 19.6 mg/dL (ref 18.0–45.0)

## 2010-04-24 LAB — URINE CULTURE

## 2010-04-24 LAB — TRIGLYCERIDES: Triglycerides: 328 mg/dL — ABNORMAL HIGH (ref <150)

## 2010-04-24 LAB — CHOLESTEROL, TOTAL: Cholesterol: 143 mg/dL (ref 0–200)

## 2010-04-24 LAB — CATH TIP CULTURE: Culture: NO GROWTH

## 2010-04-24 LAB — CARDIAC PANEL(CRET KIN+CKTOT+MB+TROPI)
CK, MB: 3.3 ng/mL (ref 0.3–4.0)
Relative Index: 2 (ref 0.0–2.5)
Total CK: 162 U/L (ref 7–232)

## 2010-04-25 LAB — GLUCOSE, CAPILLARY
Glucose-Capillary: 100 mg/dL — ABNORMAL HIGH (ref 70–99)
Glucose-Capillary: 106 mg/dL — ABNORMAL HIGH (ref 70–99)
Glucose-Capillary: 106 mg/dL — ABNORMAL HIGH (ref 70–99)
Glucose-Capillary: 111 mg/dL — ABNORMAL HIGH (ref 70–99)
Glucose-Capillary: 112 mg/dL — ABNORMAL HIGH (ref 70–99)
Glucose-Capillary: 116 mg/dL — ABNORMAL HIGH (ref 70–99)
Glucose-Capillary: 118 mg/dL — ABNORMAL HIGH (ref 70–99)
Glucose-Capillary: 120 mg/dL — ABNORMAL HIGH (ref 70–99)
Glucose-Capillary: 122 mg/dL — ABNORMAL HIGH (ref 70–99)
Glucose-Capillary: 124 mg/dL — ABNORMAL HIGH (ref 70–99)
Glucose-Capillary: 126 mg/dL — ABNORMAL HIGH (ref 70–99)
Glucose-Capillary: 127 mg/dL — ABNORMAL HIGH (ref 70–99)
Glucose-Capillary: 132 mg/dL — ABNORMAL HIGH (ref 70–99)
Glucose-Capillary: 133 mg/dL — ABNORMAL HIGH (ref 70–99)
Glucose-Capillary: 140 mg/dL — ABNORMAL HIGH (ref 70–99)
Glucose-Capillary: 141 mg/dL — ABNORMAL HIGH (ref 70–99)
Glucose-Capillary: 151 mg/dL — ABNORMAL HIGH (ref 70–99)
Glucose-Capillary: 151 mg/dL — ABNORMAL HIGH (ref 70–99)
Glucose-Capillary: 153 mg/dL — ABNORMAL HIGH (ref 70–99)
Glucose-Capillary: 154 mg/dL — ABNORMAL HIGH (ref 70–99)
Glucose-Capillary: 155 mg/dL — ABNORMAL HIGH (ref 70–99)
Glucose-Capillary: 157 mg/dL — ABNORMAL HIGH (ref 70–99)
Glucose-Capillary: 164 mg/dL — ABNORMAL HIGH (ref 70–99)
Glucose-Capillary: 165 mg/dL — ABNORMAL HIGH (ref 70–99)
Glucose-Capillary: 178 mg/dL — ABNORMAL HIGH (ref 70–99)
Glucose-Capillary: 179 mg/dL — ABNORMAL HIGH (ref 70–99)
Glucose-Capillary: 181 mg/dL — ABNORMAL HIGH (ref 70–99)
Glucose-Capillary: 192 mg/dL — ABNORMAL HIGH (ref 70–99)
Glucose-Capillary: 211 mg/dL — ABNORMAL HIGH (ref 70–99)
Glucose-Capillary: 220 mg/dL — ABNORMAL HIGH (ref 70–99)
Glucose-Capillary: 224 mg/dL — ABNORMAL HIGH (ref 70–99)
Glucose-Capillary: 28 mg/dL — CL (ref 70–99)
Glucose-Capillary: 284 mg/dL — ABNORMAL HIGH (ref 70–99)

## 2010-04-25 LAB — CBC
HCT: 40.3 % (ref 39.0–52.0)
HCT: 42.9 % (ref 39.0–52.0)
HCT: 43.6 % (ref 39.0–52.0)
HCT: 44 % (ref 39.0–52.0)
Hemoglobin: 12.6 g/dL — ABNORMAL LOW (ref 13.0–17.0)
Hemoglobin: 14.5 g/dL (ref 13.0–17.0)
Hemoglobin: 14.7 g/dL (ref 13.0–17.0)
Hemoglobin: 15.5 g/dL (ref 13.0–17.0)
MCHC: 33.3 g/dL (ref 30.0–36.0)
MCHC: 33.4 g/dL (ref 30.0–36.0)
MCHC: 34 g/dL (ref 30.0–36.0)
MCHC: 34 g/dL (ref 30.0–36.0)
MCHC: 34.2 g/dL (ref 30.0–36.0)
MCHC: 34.2 g/dL (ref 30.0–36.0)
MCHC: 34.3 g/dL (ref 30.0–36.0)
MCHC: 34.3 g/dL (ref 30.0–36.0)
MCHC: 34.5 g/dL (ref 30.0–36.0)
MCHC: 34.7 g/dL (ref 30.0–36.0)
MCV: 88.9 fL (ref 78.0–100.0)
MCV: 89.1 fL (ref 78.0–100.0)
MCV: 90.5 fL (ref 78.0–100.0)
MCV: 90.6 fL (ref 78.0–100.0)
MCV: 91.2 fL (ref 78.0–100.0)
MCV: 91.6 fL (ref 78.0–100.0)
Platelets: 342 10*3/uL (ref 150–400)
Platelets: 398 10*3/uL (ref 150–400)
Platelets: 412 10*3/uL — ABNORMAL HIGH (ref 150–400)
Platelets: 442 10*3/uL — ABNORMAL HIGH (ref 150–400)
RBC: 3.77 MIL/uL — ABNORMAL LOW (ref 4.22–5.81)
RBC: 3.83 MIL/uL — ABNORMAL LOW (ref 4.22–5.81)
RBC: 4.1 MIL/uL — ABNORMAL LOW (ref 4.22–5.81)
RBC: 4.53 MIL/uL (ref 4.22–5.81)
RBC: 4.75 MIL/uL (ref 4.22–5.81)
RBC: 4.93 MIL/uL (ref 4.22–5.81)
RBC: 5.03 MIL/uL (ref 4.22–5.81)
RDW: 12.9 % (ref 11.5–15.5)
RDW: 13.2 % (ref 11.5–15.5)
RDW: 13.3 % (ref 11.5–15.5)
RDW: 13.3 % (ref 11.5–15.5)
RDW: 13.4 % (ref 11.5–15.5)
RDW: 13.4 % (ref 11.5–15.5)
RDW: 13.6 % (ref 11.5–15.5)
WBC: 16 10*3/uL — ABNORMAL HIGH (ref 4.0–10.5)

## 2010-04-25 LAB — DIFFERENTIAL
Basophils Absolute: 0 10*3/uL (ref 0.0–0.1)
Basophils Relative: 0 % (ref 0–1)
Eosinophils Absolute: 0.3 10*3/uL (ref 0.0–0.7)
Eosinophils Relative: 0 % (ref 0–5)
Eosinophils Relative: 0 % (ref 0–5)
Eosinophils Relative: 1 % (ref 0–5)
Eosinophils Relative: 3 % (ref 0–5)
Lymphocytes Relative: 13 % (ref 12–46)
Lymphocytes Relative: 3 % — ABNORMAL LOW (ref 12–46)
Lymphocytes Relative: 7 % — ABNORMAL LOW (ref 12–46)
Lymphocytes Relative: 9 % — ABNORMAL LOW (ref 12–46)
Lymphs Abs: 0.4 10*3/uL — ABNORMAL LOW (ref 0.7–4.0)
Lymphs Abs: 1 10*3/uL (ref 0.7–4.0)
Lymphs Abs: 1.4 10*3/uL (ref 0.7–4.0)
Lymphs Abs: 1.6 10*3/uL (ref 0.7–4.0)
Monocytes Absolute: 0.4 10*3/uL (ref 0.1–1.0)
Monocytes Relative: 10 % (ref 3–12)
Monocytes Relative: 3 % (ref 3–12)
Neutro Abs: 11.6 10*3/uL — ABNORMAL HIGH (ref 1.7–7.7)
Neutrophils Relative %: 87 % — ABNORMAL HIGH (ref 43–77)
Neutrophils Relative %: 95 % — ABNORMAL HIGH (ref 43–77)

## 2010-04-25 LAB — POCT I-STAT 3, ART BLOOD GAS (G3+)
Acid-Base Excess: 4 mmol/L — ABNORMAL HIGH (ref 0.0–2.0)
Acid-Base Excess: 5 mmol/L — ABNORMAL HIGH (ref 0.0–2.0)
Acid-base deficit: 1 mmol/L (ref 0.0–2.0)
Acid-base deficit: 1 mmol/L (ref 0.0–2.0)
Bicarbonate: 25.8 mEq/L — ABNORMAL HIGH (ref 20.0–24.0)
Bicarbonate: 29 mEq/L — ABNORMAL HIGH (ref 20.0–24.0)
Bicarbonate: 29.1 mEq/L — ABNORMAL HIGH (ref 20.0–24.0)
Bicarbonate: 29.8 mEq/L — ABNORMAL HIGH (ref 20.0–24.0)
O2 Saturation: 85 %
O2 Saturation: 99 %
O2 Saturation: 99 %
O2 Saturation: 99 %
Patient temperature: 98
Patient temperature: 98.2
Patient temperature: 98.5
Patient temperature: 98.6
TCO2: 27 mmol/L (ref 0–100)
TCO2: 28 mmol/L (ref 0–100)
TCO2: 31 mmol/L (ref 0–100)
TCO2: 32 mmol/L (ref 0–100)
pCO2 arterial: 42.4 mmHg (ref 35.0–45.0)
pCO2 arterial: 44.6 mmHg (ref 35.0–45.0)
pCO2 arterial: 46.7 mmHg — ABNORMAL HIGH (ref 35.0–45.0)
pCO2 arterial: 57.7 mmHg (ref 35.0–45.0)
pCO2 arterial: 73 mmHg (ref 35.0–45.0)
pCO2 arterial: 73.7 mmHg (ref 35.0–45.0)
pH, Arterial: 7.203 — ABNORMAL LOW (ref 7.350–7.450)
pH, Arterial: 7.265 — ABNORMAL LOW (ref 7.350–7.450)
pH, Arterial: 7.266 — ABNORMAL LOW (ref 7.350–7.450)
pH, Arterial: 7.417 (ref 7.350–7.450)
pO2, Arterial: 120 mmHg — ABNORMAL HIGH (ref 80.0–100.0)
pO2, Arterial: 182 mmHg — ABNORMAL HIGH (ref 80.0–100.0)
pO2, Arterial: 67 mmHg — ABNORMAL LOW (ref 80.0–100.0)
pO2, Arterial: 86 mmHg (ref 80.0–100.0)

## 2010-04-25 LAB — LIPID PANEL
Cholesterol: 122 mg/dL (ref 0–200)
HDL: 14 mg/dL — ABNORMAL LOW (ref >39)
HDL: 18 mg/dL — ABNORMAL LOW (ref >39)
Total CHOL/HDL Ratio: 8.7 RATIO
VLDL: 24 mg/dL (ref 0–40)
VLDL: 41 mg/dL — ABNORMAL HIGH (ref 0–40)

## 2010-04-25 LAB — BASIC METABOLIC PANEL
BUN: 20 mg/dL (ref 6–23)
CO2: 27 mEq/L (ref 19–32)
CO2: 29 mEq/L (ref 19–32)
CO2: 30 mEq/L (ref 19–32)
CO2: 34 mEq/L — ABNORMAL HIGH (ref 19–32)
Calcium: 8.1 mg/dL — ABNORMAL LOW (ref 8.4–10.5)
Calcium: 8.4 mg/dL (ref 8.4–10.5)
Calcium: 9 mg/dL (ref 8.4–10.5)
Chloride: 102 mEq/L (ref 96–112)
Chloride: 106 mEq/L (ref 96–112)
Creatinine, Ser: 0.86 mg/dL (ref 0.4–1.5)
Creatinine, Ser: 0.87 mg/dL (ref 0.4–1.5)
Creatinine, Ser: 0.94 mg/dL (ref 0.4–1.5)
GFR calc Af Amer: >60 mL/min (ref >60)
GFR calc Af Amer: >60 mL/min (ref >60)
GFR calc Af Amer: >60 mL/min (ref >60)
GFR calc non Af Amer: >60 mL/min (ref >60)
GFR calc non Af Amer: >60 mL/min (ref >60)
Glucose, Bld: 140 mg/dL — ABNORMAL HIGH (ref 70–99)
Glucose, Bld: 149 mg/dL — ABNORMAL HIGH (ref 70–99)
Glucose, Bld: 155 mg/dL — ABNORMAL HIGH (ref 70–99)
Potassium: 3.8 mEq/L (ref 3.5–5.1)
Potassium: 4.9 mEq/L (ref 3.5–5.1)
Sodium: 138 mEq/L (ref 135–145)
Sodium: 141 mEq/L (ref 135–145)
Sodium: 141 mEq/L (ref 135–145)
Sodium: 142 mEq/L (ref 135–145)

## 2010-04-25 LAB — COMPREHENSIVE METABOLIC PANEL
ALT: 26 U/L (ref 0–53)
ALT: 33 U/L (ref 0–53)
ALT: 33 U/L (ref 0–53)
AST: 14 U/L (ref 0–37)
AST: 15 U/L (ref 0–37)
AST: 20 U/L (ref 0–37)
AST: 21 U/L (ref 0–37)
AST: 22 U/L (ref 0–37)
Albumin: 2.6 g/dL — ABNORMAL LOW (ref 3.5–5.2)
Albumin: 2.8 g/dL — ABNORMAL LOW (ref 3.5–5.2)
Albumin: 2.8 g/dL — ABNORMAL LOW (ref 3.5–5.2)
Alkaline Phosphatase: 74 U/L (ref 39–117)
Alkaline Phosphatase: 84 U/L (ref 39–117)
BUN: 13 mg/dL (ref 6–23)
CO2: 26 mEq/L (ref 19–32)
CO2: 29 mEq/L (ref 19–32)
CO2: 30 mEq/L (ref 19–32)
CO2: 32 mEq/L (ref 19–32)
CO2: 35 mEq/L — ABNORMAL HIGH (ref 19–32)
Calcium: 7.4 mg/dL — ABNORMAL LOW (ref 8.4–10.5)
Calcium: 8.1 mg/dL — ABNORMAL LOW (ref 8.4–10.5)
Calcium: 8.3 mg/dL — ABNORMAL LOW (ref 8.4–10.5)
Calcium: 8.7 mg/dL (ref 8.4–10.5)
Calcium: 8.9 mg/dL (ref 8.4–10.5)
Chloride: 101 mEq/L (ref 96–112)
Creatinine, Ser: 0.85 mg/dL (ref 0.4–1.5)
Creatinine, Ser: 0.89 mg/dL (ref 0.4–1.5)
Creatinine, Ser: 0.96 mg/dL (ref 0.4–1.5)
Creatinine, Ser: 0.97 mg/dL (ref 0.4–1.5)
Creatinine, Ser: 1.03 mg/dL (ref 0.4–1.5)
GFR calc Af Amer: >60 mL/min (ref >60)
GFR calc Af Amer: >60 mL/min (ref >60)
GFR calc Af Amer: >60 mL/min (ref >60)
GFR calc Af Amer: >60 mL/min (ref >60)
GFR calc Af Amer: >60 mL/min (ref >60)
GFR calc Af Amer: >60 mL/min (ref >60)
GFR calc Af Amer: >60 mL/min (ref >60)
GFR calc non Af Amer: >60 mL/min (ref >60)
GFR calc non Af Amer: >60 mL/min (ref >60)
GFR calc non Af Amer: >60 mL/min (ref >60)
GFR calc non Af Amer: >60 mL/min (ref >60)
GFR calc non Af Amer: >60 mL/min (ref >60)
Glucose, Bld: 263 mg/dL — ABNORMAL HIGH (ref 70–99)
Potassium: 4.3 mEq/L (ref 3.5–5.1)
Potassium: 4.6 mEq/L (ref 3.5–5.1)
Sodium: 137 mEq/L (ref 135–145)
Sodium: 137 mEq/L (ref 135–145)
Sodium: 138 mEq/L (ref 135–145)
Total Bilirubin: 0.8 mg/dL (ref 0.3–1.2)
Total Protein: 5.2 g/dL — ABNORMAL LOW (ref 6.0–8.3)
Total Protein: 6.6 g/dL (ref 6.0–8.3)
Total Protein: 7.2 g/dL (ref 6.0–8.3)
Total Protein: 7.7 g/dL (ref 6.0–8.3)

## 2010-04-25 LAB — CARDIAC PANEL(CRET KIN+CKTOT+MB+TROPI)
CK, MB: 2.3 ng/mL (ref 0.3–4.0)
CK, MB: 6.5 ng/mL — ABNORMAL HIGH (ref 0.3–4.0)
Relative Index: 1.8 (ref 0.0–2.5)
Relative Index: 2.4 (ref 0.0–2.5)
Relative Index: 2.4 (ref 0.0–2.5)
Troponin I: <0.01 ng/mL (ref 0.00–0.06)

## 2010-04-25 LAB — TRIGLYCERIDES
Triglycerides: 147 mg/dL (ref <150)
Triglycerides: 227 mg/dL — ABNORMAL HIGH (ref <150)

## 2010-04-25 LAB — CULTURE, BAL-QUANTITATIVE W GRAM STAIN: Colony Count: NO GROWTH

## 2010-04-25 LAB — CULTURE, ROUTINE-ABSCESS

## 2010-04-25 LAB — MAGNESIUM
Magnesium: 1.7 mg/dL (ref 1.5–2.5)
Magnesium: 2.1 mg/dL (ref 1.5–2.5)
Magnesium: 2.2 mg/dL (ref 1.5–2.5)
Magnesium: 2.3 mg/dL (ref 1.5–2.5)

## 2010-04-25 LAB — PREALBUMIN
Prealbumin: 19.9 mg/dL (ref 18.0–45.0)
Prealbumin: 7.3 mg/dL — ABNORMAL LOW (ref 18.0–45.0)

## 2010-04-25 LAB — VANCOMYCIN, TROUGH
Vancomycin Tr: 13.4 ug/mL (ref 10.0–20.0)
Vancomycin Tr: 17.1 ug/mL (ref 10.0–20.0)
Vancomycin Tr: 22.1 ug/mL — ABNORMAL HIGH (ref 10.0–20.0)

## 2010-04-25 LAB — URINALYSIS, ROUTINE W REFLEX MICROSCOPIC
Hgb urine dipstick: NEGATIVE
Ketones, ur: 15 mg/dL — AB
Nitrite: NEGATIVE
pH: 5.5 (ref 5.0–8.0)

## 2010-04-25 LAB — PHOSPHORUS
Phosphorus: 2.1 mg/dL — ABNORMAL LOW (ref 2.3–4.6)
Phosphorus: 2.3 mg/dL (ref 2.3–4.6)
Phosphorus: 4.5 mg/dL (ref 2.3–4.6)
Phosphorus: 4.6 mg/dL (ref 2.3–4.6)

## 2010-04-25 LAB — CARBOXYHEMOGLOBIN: Total hemoglobin: 14.6 g/dL (ref 13.5–18.0)

## 2010-04-25 LAB — URINE MICROSCOPIC-ADD ON

## 2010-04-25 LAB — URINE CULTURE: Special Requests: NEGATIVE

## 2010-04-25 LAB — CULTURE, BLOOD (ROUTINE X 2)

## 2010-04-25 LAB — CHOLESTEROL, TOTAL
Cholesterol: 135 mg/dL (ref 0–200)
Cholesterol: 93 mg/dL (ref 0–200)
Cholesterol: 98 mg/dL (ref 0–200)

## 2010-04-25 LAB — SEDIMENTATION RATE: Sed Rate: 47 mm/hr — ABNORMAL HIGH (ref 0–16)

## 2010-04-25 LAB — ANAEROBIC CULTURE

## 2010-04-25 LAB — LACTIC ACID, PLASMA: Lactic Acid, Venous: 2.7 mmol/L — ABNORMAL HIGH (ref 0.5–2.2)

## 2010-05-29 NOTE — Op Note (Signed)
NAME:  Daniel Norton, Daniel Norton NO.:  [id number]   MEDICAL RECORD NO.:  [id number]          PATIENT TYPE:  INP   LOCATION:  5157                         FACILITY:  [facility]   PHYSICIAN:  Ollen Gross. Vernell Morgans, M.D. DATE OF BIRTH:  1950-07-11   DATE OF PROCEDURE:  06/03/2008  DATE OF DISCHARGE:                               OPERATIVE REPORT   PREOPERATIVE DIAGNOSES:  Enterocutaneous fistula, chronic abdominal wall  abscess.   POSTOPERATIVE DIAGNOSES:  Enterocutaneous fistula, chronic abdominal  wall abscess.   PROCEDURE:  Exploratory laparotomy, extensive lysis of adhesions  approximately one and a half hour take down of enterocutaneous fistula,  small bowel resection, repair of abdominal wall defect, and repair of  serosal tear x1.   SURGEON:  Ollen Gross. Vernell Morgans, MD   ASSISTANT:  Gabrielle Dare. Janee Morn, MD   ANESTHESIA:  General endotracheal.   DESCRIPTION OF PROCEDURE:  After informed consent was obtained, the  patient was brought to the operating room, and placed in supine position  on the operating table.  After induction of general anesthesia, the  patient's abdomen was prepped with Betadine and draped in usual sterile  manner.  A upper midline incision was made with a 10 blade knife.  This  incision was carried down through the skin and subcutaneous tissue  sharply with the electrocautery until the fascia of the anterior  abdominal wall and previous mesh were encountered.  This was all opened  with the electrocautery.  The preperitoneal space was probed bluntly  with the hemostat, the peritoneum was opened access was gained to the  abdominal cavity.  At this point, there were significant number of  adhesions of small bowel to the anterior abdominal wall mesh.  These  were meticulously taken down.  This all took about an hour and a half to  free the anterior abdominal wall.  There were also some interloop  adhesions that were also taken down sharply with Metzenbaum  scissors.  Once this was accomplished we were able to take the dissection of the  small bowel from the anterior abdominal wall off to the right side where  the fistula was located.  We were able to get all the way around the  small intestine on that side and then we took the fistula down once we  were able to accomplish this, we closed the fistula tract with a 2-0  silk stitch to minimize any contamination.  The proximal and distal  segments of small bowel in this area were then also freed up from their  adhesions until we have good mobilization of the small intestine at this  point.  During take down of the adhesions to the anterior abdominal  wall, we did make 1 small serosal tear and lupus small bowel was then  repaired with interrupted 3-0 silk Lembert stitches and this loop of  small bowel looked good.  At this point, the anterior abdominal wall in  the right where the patient had had this chronic abscesses was very  thick in that area right at the edges of mesh and where the stitches  were located.  The mesh was debrided from this area sharply with the  electrocautery and a chronic abscess cavity was identified.  This cavity  was destroyed with the electrocautery.  Once a mesh in this area was  removed, but all of the mesh of the anterior abdominal wall was well  incorporated in to the abdominal wall.  Once this was accomplished, we  then choose a piece of 10 x 16-cm Stratus, biologic material to cover  the abdominal wall at this point.  This Stratus material was anchored to  the abdominal wall circumferentially around the defect with interrupted  #1 PDS sutures.  Once this was accomplished, this Stratus material was  in good position covering the abdominal wall.  The Stratus was divided  at the edge of the abdominal midline incision.  The rest of granulation  tissue leading up to the fistula tract was also destroyed with the  electrocautery.  Once this accomplished, the attention  was then turned  to the small bowel, the site was chosen above and below the area where  the fistula was where the small bowel appeared to be healthy.  The  mesentery at each of these points was opened sharply with  electrocautery.  The mesentery to this area was taken down with the  ligature and the vessels were controlled with 3-0 and 2-0 silk suture,  figure-of-eight suture ligatures.  The small enterotomies were then made  on the antimesenteric border of the small intestine at each of these  points.  This was done was done with the electrocautery.  Each limb of  GI 55 staples was then placed down the appropriate limb of small bowel,  clamped, and fired thereby creating a nice widely patent  enteroenterostomy.  The common enterotomy was closed with a single  firing of  TA-60, and the area of the fistula tract was then removed on  the go side of the stapler with a curved mayo's and this was sent to  Pathology as enterocutaneous fistula.  Some oozing from the staple line  was controlled with 3-0 silk figure-of-eight stitches, crotch stitch of  2-0 silk was then placed to reinforce the anastomosis.  The anastomosis  was widely patent for 2 fingers and looked healthy.  At this point, the  abdomen was then irrigated with copious amounts of saline.  The anterior  abdominal wall was then closed with 2 running #1 looped double stranded  PDS sutures.  Unfortunately, the abdominal wall was so thin on the  inferior portion of the midline incision that we were unable to close  this area with a permanent stitch because it appears that the skin and  the mesh were pretty much fused together superiorly along the midline  incision, the abdominal wall thickness was greater and we were able to  close the upper portion of the mesh with couple of interrupted #1  Novafil stitches as well as with the running #1 double stranded loop PDS  stitch.  The skin of the upper portion and midline incision was able to   be closed with staples.  The lower portion had to be left open because  of the skin was not mobile enough to close.  The granulation tissue  externally at the fistula site was also destroyed with the  electrocautery.  Small piece of adaptic was placed over this, and then a  abdominal VAC dressing was applied.  The patient tolerated the procedure  well.  At the end of the  case, all needle, sponge, and instrument counts  were correct.  The patient was then awakened and taken to recovery in  stable condition.      Ollen Gross. Vernell Morgans, M.D.  Electronically Signed     PST/MEDQ  D:  06/03/2008  T:  06/04/2008  Job:  [id number]

## 2010-05-29 NOTE — Op Note (Signed)
[name], Daniel Norton NO.:  [id number]   MEDICAL RECORD NO.:  [id number]          PATIENT TYPE:  INP   LOCATION:  2105                         FACILITY:  [facility]   PHYSICIAN:  Ollen Gross. Vernell Morgans, M.D. DATE OF BIRTH:  1950-11-26   DATE OF PROCEDURE:  04/29/2008  DATE OF DISCHARGE:                               OPERATIVE REPORT   PREOPERATIVE DIAGNOSIS:  Abdominal wall abscess.   POSTOPERATIVE DIAGNOSIS:  Abdominal wall abscess with possible  enterocutaneous fistula.   PROCEDURES:  Incision and drainage of abdominal wall abscess.   SURGEON:  Ollen Gross. Vernell Morgans, M.D.   ASSISTANT:  Juanetta Gosling, MD   ANESTHESIA:  General endotracheal.   PROCEDURE:  After informed consent was obtained, the patient was brought  to the operating room, placed in the supine position on the operating  table.  After adequate induction of general anesthesia, the patient's  abdomen was prepped with Betadine and draped in the usual sterile  manner.  Just to the right of midline, the patient had a large fluctuant  area.  This was opened sharply with a 10 blade knife.  A large amount of  purulent material was able to be expressed.  This was cultured and then  evacuated with suction.  The wound itself once opened was probed with a  hemostat.  It did appear as though a small pocket extended inferior to  this area and this was also opened sharply with the electrocautery and a  small pocket of pus was also drained from there.  The wound also  appeared to track laterally to the right and deep and this was probed  with a hemostat as well.  No further purulent material was able to be  identified deep to this open area.  Medially, there did appear to be a  small piece of exposed mesh.  The small piece of exposed mesh was  excised with the Metzenbaum scissors in the same area.  At one point, it  also appeared as though there may be a small bubble of bile that came  out.  This area was watched for  probably another 15-20 minutes and no  other evidence of bile leakage could be appreciated.  At this point, the  abscess cavity appeared to be well drained and opened.  The wound itself  was packed with moistened Kerlix gauze.  Attention was then turned to  the left side of his abdomen where he had an old ostomy site that  periodically were drained, this was opened bluntly with a hemostat and a  trach probed, a Novofil stitch was identified which was grasped with a  hemostat, elevated and then excised with suture scissors.  No other  evidence of Novofil stitches could be appreciated in the site.  This  wound was packed with a 2 x 2 gauze.  Sterile dressings were then  applied.  The patient tolerated the procedure well.  At the end of the  case, all needles, sponge, and instrument counts were correct.  At  extubation the patient had a problem with broncospasm possibly secondary  to aspiration which required him to be reintubated. He will be taken on  vent to icu for further resuscitation and pulmonary management.     Ollen Gross. Vernell Morgans, M.D.  Electronically Signed    PST/MEDQ  D:  04/29/2008  T:  04/30/2008  Job:  [id number]

## 2010-06-01 NOTE — Op Note (Signed)
NAMEMURPHY, DUZAN                ACCOUNT NO.:  [id number]   MEDICAL RECORD NO.:  [id number]          PATIENT TYPE:  INP   LOCATION:  5713                         FACILITY:  [facility]   PHYSICIAN:  Ollen Gross. Vernell Morgans, M.D. DATE OF BIRTH:  May 26, 1950   DATE OF PROCEDURE:  09/12/2004  DATE OF DISCHARGE:                                 OPERATIVE REPORT   PREOPERATIVE DIAGNOSIS:  Recurrent ventral hernia.   POSTOPERATIVE DIAGNOSIS:  Recurrent ventral hernia.   PROCEDURE:  Ventral hernia repair with mesh.   SURGEON:  Dr. Carolynne Edouard.   ASSISTANT:  Dr. Luisa Hart   ANESTHESIA:  General endotracheal.   PROCEDURE:  After informed consent was obtained, the patient was brought to  the operating placed in a supine position on the operating table.  After  induction of general anesthesia, the patient's abdomen was prepped with  Betadine and draped in the usual sterile manner.  A midline incision was  started at the upper portion of his old scar and extending superiorly into  unmarked tissue.  This incision was made with a 10 blade knife.  The  incision was carried down through the skin and subcutaneous tissue sharply  with the electrocautery until the linea alba was identified.  The linea alba  was also incised with the electrocautery.  The preperitoneal space was  probed bluntly with a hemostat until the peritoneum was opened and access  was gained to the abdominal cavity.  The midline scar had a lot of adhesions  of small intestine, but these were very filmy adhesions that were able to be  taken down by gentle blunt finger dissection.  Once this was accomplished,  the rest of the midline incision inferiorly was able to be opened up with  the electrocautery under direct vision.  Once this was accomplished, the  edges of the scar and fascia were grasped with Kocher clamps, and the  adhesions of intestine to the abdominal wall were taken down sharply by  careful dissection with the Metzenbaum scissors.   Once free space had been  identified on both sides of the abdomen circumferentially and the abdominal  wall was clear and free, then the edges of the defect were examined.  Some  of the Swiss-cheese effect of the fascia had to be trimmed away sharply with  the electrocautery back to a healthier-appearing fascia.  The subcutaneous  tissue was undermined at the level of the anterior abdominal wall fascia,  creating flaps.  This was also done circumferentially and done sharply with  the electrocautery.  Once this had been accomplished, the rest of the  abdomen looked good.  A piece of 20 x 25 cm Proceed mesh was chosen.  The  mesh was oriented with the blue side up away from the intestine, and this  mesh was sewed through the fascia and muscular layer of the abdominal wall  with interrupted horizontal mattress stitches.  This was done in an  interrupted fashion and was done circumferentially up underneath the  subcutaneous flaps.  Once this mesh had been placed, the edges of the mesh  were checked, and there were no gaps within the mesh where intestine could  herniate up into, and the mesh was in very good position without any  tension.  The abdomen was then irrigated at this point with copious amounts  of saline and Betadine.  The remaining fascia of the anterior abdominal wall  was closed with two running #1 Novofil stitches.  The mesh was placed with  interrupted #1 Novofil stitches.  Once this was accomplished, the  subcutaneous tissue was irrigated with saline and Betadine.  A small stab  incision was made in the left lower quadrant, and a tonsil clamp was placed  through this stab incision and used to bring a drain through the skin into  the wound.  The drain was placed on both sides of the subcutaneous flap.  This drain was then anchored to the skin with a 3-0 nylon stitch.  The scar  from the previous surgeries was then excised sharply with a 15 blade knife  back to fresh  healthy  skin edges.  This included sacrificing the umbilicus, and then the  skin was closed with staples.  Sterile dressings were applied.  The patient  tolerated the procedure well.  At the end of the case, all needle, sponge,  and instrument counts were correct.  The patient was then awakened and taken  to recovery in stable condition.      Ollen Gross. Vernell Morgans, M.D.  Electronically Signed     PST/MEDQ  D:  09/17/2004  T:  09/17/2004  Job:  [id number]

## 2010-06-01 NOTE — Discharge Summary (Signed)
NAME:  EVYN, PUTZIER NO.:  [id number]   MEDICAL RECORD NO.:  [id number]          PATIENT TYPE:  INP   LOCATION:  5157                         FACILITY:  [facility]   PHYSICIAN:  Ollen Gross. Vernell Morgans, M.D. DATE OF BIRTH:  1950-05-01   DATE OF ADMISSION:  04/29/2008  DATE OF DISCHARGE:  06/10/2008                               DISCHARGE SUMMARY   HISTORY OF PRESENT ILLNESS:  Mr. Meister is a 60 year old gentleman who  was brought to the operating room for an incision and drainage of an  abdominal wall abscess.  At that time, some mesh was noted at the bottom  of the wound and during the debridement some bilious like material was  also identified.  His wound was packed.  He was started on bowel rest.  He actually also had some aspiration at the time of wake up from this  operation which put him in respiratory distress and he had to be  transferred to the ICU.  Over the next several days, as his lungs  recovered, he had no real bilious drainage during the first few days.  Once he was off the ventilator and we were able to start diet on him.  We did start to notice bilious drainage from the wound consistent with  an enterocutaneous fistula.  At that point, he was on broad-spectrum  antibiotics for the aspiration and the abdominal wall abscess.  He  recovered from a pulmonary standpoint.  We kept him in the hospital  during this period of time on total parenteral nutrition and we tried  bowel rest for a quite some time and unfortunately he continued to have  bilious drainage from the wound.  At this point, it was fairly obvious  that the fistula was not going to close spontaneously despite bowel  rest, adequate nutritions, some amount of statin.  We gave him in the  hospital for several weeks to try to improve his nutrition state and to  manage his bilious output.  We then took him to the operating room on  Jun 03, 2008, for repair of the enterocutaneous fistula, small bowel  resection, lysis of adhesions, repair of his abdominal wall with a  Strattice biologic mesh.  He tolerated this well.  He had an NG.  Postoperatively, was restarted on subcu heparin for DVT prophylaxis and  his midline wound was VAC.  On Jun 07, 2008, we were able to clamp his  NG.  We were changing his VAC at the bedside.  We started clears on Jun 08, 2008, and discontinued NG.  We were slowly advanced his diet and by  Jun 10, 2008, he was tolerating a diet.  His wounds were clean and he  was ready for discharge home.   MEDICATIONS:  He was to resume his home meds and given a prescription  for narcotic pain medicine.   ACTIVITIES:  No heavy lifting.   DIET:  As tolerated.   FOLLOWUP:  Dr. Carolynne Edouard in a week and he is a discharged home.      Ollen Gross. Vernell Morgans, M.D.  Electronically Signed     PST/MEDQ  D:  08/05/2008  T:  08/06/2008  Job:  [id number]

## 2010-06-01 NOTE — Op Note (Signed)
NAME:  Daniel Norton, Daniel Norton NO.:  [id number]   MEDICAL RECORD NO.:  [id number]                   PATIENT TYPE:  INP   LOCATION:  3739                                 FACILITY:  [facility]   PHYSICIAN:  Ollen Gross. Vernell Morgans, M.D.              DATE OF BIRTH:  06-Jan-1951   DATE OF PROCEDURE:  09/22/2002  DATE OF DISCHARGE:  10/01/2002                                 OPERATIVE REPORT   PREOPERATIVE DIAGNOSIS:  Perforated sigmoid diverticulitis.   POSTOPERATIVE DIAGNOSIS:  Perforated sigmoid diverticulitis.   PROCEDURE:  Exploratory laparotomy and sigmoid colectomy and proximal  colostomy with Hartmann procedure.   SURGEON:  Ollen Gross. Carolynne Edouard, M.D.   ASSISTANT:  Lebron Conners, M.D.   ANESTHESIA:  General endotracheal anesthesia.   DESCRIPTION OF PROCEDURE:  After informed consent was obtained the patient  was brought to the operating room and placed in the supine position on the  operating table. After adequate induction of general endotracheal anesthesia  the patient's abdomen  was prepped with Betadine and draped in the usual  sterile manner.   A midline incision was made with a #10 blade knife. This incision was  carried down through the skin  and subcutaneous tissue sharply with the  electrocautery until the linea alba was identified. The linea alba was also  incised with the electrocautery. The preperitoneal space was probed bluntly  with a hemostat until the peritoneum was opened.   Next access was gained to the abdominal cavity. The rest of the incision was  then opened under direct vision with the electrocautery. A Balfour retractor  was then placed to retract the sidewalls of the abdomen laterally and a  bladder blade was placed for  inferior  retraction.   On entering the abdomen there was purulent drainage. This was cultured and  gram stained, and the free fluid was evacuated with the suction. The omentum  and small bowel were then packed upwards in  the abdomen  using moist laps  and a Balfour extender arm was placed on the Balfour retractor and used to  hold the small bowel and omentum  up and out of the way.   Attention was then turned down to the sigmoid colon area. There was obvious  perforation and soilage in  this area. The descending and sigmoid colon were  mobilized by excising the colon's retroperitoneal attachment along the white  line of Toldt. The perforation had occurred  initially into the mesentery  and  then appeared  to free the perforate into the abdominal cavity from  there. There was a lot of  soilage noted in the mesentery and  retroperitoneum proximal to this area of sigmoid. The colon appeared  to be  healthy and without inflammatory changes.   A site was chosen proximal to the diseased area for division of the colon  and the mesentery was opened in this area sharply  using the Bovie  electrocautery. A GIA 75 stapler was then placed across the bowel, clamped  and fired in this area, thereby dividing the proximal  colon between  2  staple lines. The mesentery of the sigmoid colon was then taken down by  serially clamping, dividing  and ligating with 2-0 silk ties all of the  mesenteric vessels. The dissection was carried out in the mesentery away  from where  the ureter was positioned.   Once enough of the sigmoid colon had been mobilized, the rectosigmoid area  was inspected and appeared  to be healthy without inflammatory changes. A  site below the perforation was then chosen for dividing the bowel. The  mesenteric dissection was carried out to the bowel wall at this area, again  by serially clamping, dividing and ligating with 2-0 silk ties each of  the  mesenteric vessels up to this  area. The bowel wall was then cleared of any  debris circumferentially and the GIA 75 stapler again was placed across the  bowel at this point, clamped and fired, thereby dividing the bowel between  staple lines.   At this  point the diseased  portion of sigmoid colon was free and removed  from the patient and sent to pathology for further evaluation. The abdomen  was then irrigated with copious amounts of saline. The retroperitoneum was  opened where the site of perforation was  and again this area of the  retroperitoneum was irrigated with copious amounts of saline.   A small  stab incision was made in the right lower quadrant and a 19 [place]  round Blake drain was brought through the anterior abdominal  wall under  direct vision, and the drain itself was placed in the retroperitoneum at the  site of this contamination. The rectosigmoid stump was then marked at each  corner with a 2-0 Prolene stitch. The area was examined and found  to be  hemostatic.   The descending colon was mobilized a little further by incising its  retroperitoneal attachment along the white line of Toldt and scoring the  mesentery. This allowed enough of the sigmoid colon to be mobilized so that  it could be brought through the anterior abdominal  wall without difficulty.   Next all four quadrants of the abdomen was irrigated  with copious amounts  of  saline. The sponges and retractors were then  removed from the patient.  A site was chosen on the left abdominal  wall to bring out the colostomy and  a small  circular skin incision was made in this area. The subcutaneous fat  was cored out using the electrocautery and a cruciate incision was made in  the anterior and posterior  rectus sheath fascia so that 3 fingers could be  brought through the opening.   A Babcock was then placed through the opening and was used to grasp the  staple line of the descending colon. The colon was brought out through the  opening through the  anterior abdominal wall to the skin  externally. There  was no tension in this area. A single 0 Vicryl stitch was used to anchor the colon to the lining of the anterior abdominal  wall internally.   The fascia  of the anterior abdominal  wall was then closed with 2 running #1  PDS sutures. The subcutaneous tissue was irrigated with copious amounts of  saline. The upper  portion of the skin  incision was closed with staples.  The lower portion was left open and packed with moist gauze.   Next  the midline incision was covered with a sterile green towel and the  staple line of the descending colostomy was excised sharply with the  Metzenbaum scissors. The ostomy was then matured with interrupted 3-0 Vicryl  stitches. The ostomy appeared  viable at this point and was patent. An  ostomy appliance was applied. Then sterile dressings were applied to the  anterior abdominal wall incision.   The patient tolerated the procedure well. All sponge, instrument and needle  counts were correct at the end of the case. The patient was then awakened  and taken to the recovery room in stable condition.                                               Ollen Gross. Vernell Morgans, M.D.    PST/MEDQ  D:  10/04/2002  T:  10/04/2002  Job:  [id number]

## 2010-06-01 NOTE — Op Note (Signed)
   NAME:  [name], [name] NO.:  [id number]   MEDICAL RECORD NO.:  [id number]                   PATIENT TYPE:  INP   LOCATION:  2303                                 FACILITY:  [facility]   PHYSICIAN:  Gwenith Daily. Tyrone Sage, M.D.            DATE OF BIRTH:  23-Nov-1950   DATE OF PROCEDURE:  DATE OF DISCHARGE:                                 OPERATIVE REPORT   No dictation for this job.                                               Gwenith Daily Tyrone Sage, M.D.    Daniel Norton  D:  02/19/2002  T:  02/19/2002  Job:  [id number]

## 2010-06-01 NOTE — Op Note (Signed)
NAME:  Daniel Norton, Daniel Norton NO.:  [id number]   MEDICAL RECORD NO.:  [id number]                   PATIENT TYPE:  INP   LOCATION:  0370                                 FACILITY:  [facility]   PHYSICIAN:  Ollen Gross. Vernell Morgans, M.D.              DATE OF BIRTH:  [birth year]   DATE OF PROCEDURE:  01/10/2003  DATE OF DISCHARGE:  01/17/2003                                 OPERATIVE REPORT   PREOPERATIVE DIAGNOSIS:  Previous diverticulitis with colostomy.   POSTOPERATIVE DIAGNOSIS:  Previous diverticulitis with colostomy.   PROCEDURE:  Colostomy take down.   SURGEON:  Ollen Gross. Carolynne Edouard, M.D.   ASSISTANT:  Abigail Miyamoto, M.D.   ANESTHESIA:  General endotracheal.   DESCRIPTION OF PROCEDURE:  After informed consent was obtained, the patient  was brought to the operating room and placed in the supine position on the  operating table. After adequate induction of general anesthesia, the  patient's ostomy was closed with a 2-0 silk pursestring stitch. The abdomen  was then prepped with Betadine and draped in the usual sterile manner.  A  midline incision was made with a 10 blade knife through his prior incision.  Some of the scar from his old incision was excised sharply with the 15 blade  knife.  This incision was carried down sharply through the subcutaneous  tissue with the Bovie electrocautery until the fascia of the anterior  abdominal wall was encountered.  This area was probed bluntly with a  hemostatic until the peritoneum was opened and access was gained to the  abdominal cavity. The rest of the incision was then opened under direct  vision.  There were several adhesions of the small bowel to the anterior  abdominal wall that were taken down sharply with the Metzenbaum scissors.  Once we had adequate exposure, the small bowel also had quite a few  adhesions which were also taken down sharply with the Metzenbaum scissors.  Once this was accomplished, the bowel was able  to be run from the ligament  of Treitz to the ileocecal valve and all the bowel appeared to be free.  Attention was initially turned to the pelvis.  A blue suture was identified  marking the [place] pouch rectal stump.  The adhesions around this were  taken down sharply both by a combination of sharp dissection with the  Metzenbaum scissors and the blunt finger dissection.  This allowed enough  mobilization of the rectal stump to be brought up into the wound somewhat.  Next, attention was turned to the colostomy.  Adhesions to the descending  colon were taken down sharply with the Metzenbaum scissors.  Once the  colostomy was able to be surrounded easily with two fingers, the colostomy  site was then excised exteriorly sharply with a 10 blade knife. This  excision was then carried into the subcutaneous tissue with the  electrocautery until the dissection was carried  down to the fascia over the  anterior abdominal wall.  The colostomy was freed circumferentially and then  dropped back into the abdomen.  Several centimeters of the distal segment of  the descending colon and colostomy site were re-excised by taking down the  mesentery to the distal several centimeters of colon.  An Allen clamp was  then placed across the colon proximal to this where the bowel appeared to be  healthy without any diverticula and the distal segment was excised sharply  with a 10 blade knife.  Lateral stay sutures using 2-0 silk were placed on  the lateral edges of the bowel at this point. The bowel appeared to have  good blood supply.  Next, the distal rectal stump staple line was excised  sharply with the curved Mayo's.  The bowel appeared to be healthy with good  blood supply and again two lateral stay sutures were used to anchor the  distal segment of colon.  Next the posterior wall of the anastomosis was  created using interrupted 3-0 silk stitches keeping the knots on the inside.  The bowel appeared to  reapproximate easily without any undue tension.  Once  the posterior wall was complete, the anterior wall was also completed with  interrupted 3-0 silk stitches burying the knots inside. The last 3 or 4  stitches were placed in an interrupted simple manner with the knots on the  outside. All of these stitches were full thickness.  Once this was complete,  the anastomosis was inspected and it was patent to about 2 fingerbreadths  and appeared to be water tight.  At this point, gloves were changed.  There  was no real mesentery to reapproximate that would come together easily.  The  abdomen was then irrigated with copious amounts of saline.  The fascial  defect of the left abdominal wall was closed with two layers of #1 Prolene  in an interrupted fashion. The rest of the abdomen was briefly inspected and  there were no other abnormalities noted. The fascia of the anterior  abdominal wall was closed using two running #1 PDS sutures as well as  interrupted #0 Prolene sort of internal retention sutures every third to  fourth stitch. The subcutaneous tissue was irrigated with copious amounts of  saline and the skin was closed with staples.  The subcutaneous tissue of the  old colostomy site was also irrigated with copious amounts of saline and  again the skin was closed with staples.  Sterile dressings were then applied  and the patient tolerated the procedure well.  At the end of the case all  sponge, needle and instrument counts were correct.  The patient was then  awakened and taken to the recovery room in stable condition.                                               Ollen Gross. Vernell Morgans, M.D.    PST/MEDQ  D:  01/19/2003  T:  01/19/2003  Job:  [id number]

## 2010-06-01 NOTE — Op Note (Signed)
NAME:  Daniel Norton, Daniel Norton NO.:  [id number]   MEDICAL RECORD NO.:  [id number]                   PATIENT TYPE:  INP   LOCATION:  2399                                 FACILITY:  [facility]   PHYSICIAN:  Gwenith Daily. Tyrone Sage, M.D.            DATE OF BIRTH:  [birth year]   DATE OF PROCEDURE:  02/18/2002  DATE OF DISCHARGE:                                 OPERATIVE REPORT   PREOPERATIVE DIAGNOSIS:  Acute inferior myocardial infarction with three-  vessel coronary artery disease.   POSTOPERATIVE DIAGNOSIS:  Acute inferior myocardial infarction with three-  vessel coronary artery disease.   PROCEDURES:  1. Emergency coronary artery bypass grafting with intra-aortic balloon pump     in place x5 with the left internal mammary to the diagonal coronary     artery, sequential reversed saphenous vein graft to the obtuse marginal     and distal circumflex, sequential reversed saphenous vein graft to the     posterior descending and continuation branch of the right coronary     artery.  2. Left endoscopic vein harvest.   SURGEON:  Gwenith Daily. Tyrone Sage, M.D.   ASSISTANT:  Toribio Harbour, R.N.F.A/N.P..   BRIEF HISTORY:  The patient is a 59 year old heavy smoker in the past who  presented on the day of surgery with changes of acute inferior myocardial  infarction.  He was heparinized, taken to the catheterization lab for  emergency cardiac catheterization.  At the time of catheterization his EKG  changes had resolved and he was pain-free but had a very critical stenosis  of the mid-right coronary artery, distal disease involving the PD and PL  branches of the right coronary artery.  His LAD was a very small, twig-like  vessel.  There was a larger diagonal with a critical stenosis that supplied  the anterior wall.  He had a moderate-sized circumflex and obtuse marginal,  both with 70-80% stenoses.  Ejection fraction was reduced at approximately  40-45%.  Because of his  critical anatomy and unstable symptoms, an intra-  aortic balloon pump was placed in the catheterization lab and urgent  coronary artery bypass grafting was recommended.  The patient agreed and  signed informed consent.   DESCRIPTION OF PROCEDURE:  With Swan-Ganz and arterial line monitors in  place, the patient underwent general endotracheal anesthesia without  incident.  The skin of the chest and legs was prepped with Betadine and  draped in the usual sterile manner.  Initial blood gas in the operating room  revealed a PCO2 of 60.  Using endoscopic vein harvest system, initially we  tried to locate the vein in the right thigh but only could find a very small  branch.  Using the Guidant endoscopic vein system, at the left knee a vein  was located and dissected out.  Additional vein was harvested open  proceeding down the leg.  Adequate vein was obtained.  Median  sternotomy was  performed.  The left internal mammary artery was dissected down as a pedicle  graft.  The distal artery was divided and had good, free flow.  The  pericardium was opened.  Overall ventricular function appeared preserved.  His right ventricle did not appear to be significantly dilated or  hypokinetic.  He was systemically heparinized.  The ascending aorta and the  right atrium were cannulated and the aortic root vent cardioplegia needle  was introduced into the ascending aorta.  The patient was placed on  cardiopulmonary bypass 2.4 L/min. per sq. m.  Sites of anastomosis were  dissected out.  The patient's body temperature was cooled to 30 degrees, the  aortic crossclamp was applied.  Five hundred cubic centimeters of cold blood  potassium cardioplegia was administered with rapid diastolic arrest of the  heart.  Myocardial septal temperature was monitored throughout the  crossclamp period.  Attention was turned first to the posterior descending  coronary artery, which admitted a 1.5 mm probe.  Using a diamond-type  side-  to-side anastomosis was carried out with a segment of reversed saphenous  vein graft.  The distal extent of the same vein was then carried a short  distance to the continuation branch of the right coronary artery.  This  vessel was opened and was of moderate size.  Using a running 7-0 Prolene,  distal anastomosis was performed.  Additional cold blood cardioplegia was  administered down the vein graft.  Attention was then turned to the obtuse  marginal, which was 1.2-1.3 mm in size.  Using a diamond-type side-to-side  anastomosis was carried out.  The distal extent of the same vein was then  carried a short distance to the distal circumflex, which was slightly larger  and admitted a 1.5 mm probe.  Using a running 7-0 Prolene, distal  anastomosis was performed.  On the anterior surface of the heart, a very  small LAD could be located.  This was very diffusely diseased and not  suitable for bypass.  The larger diagonal was located, so it was decided to  place the mammary artery to the diagonal and the vessel was opened, and even  the diagonal was only 1 mm in size.  Using a running 8-0 Prolene, the left  internal mammary artery was anastomosed to the left anterior descending  coronary artery.  With release of the Edwards bulldog on the mammary artery,  there was appropriate rise in myocardial septal temperature.  Aortic  crossclamp was removed, total crossclamp time of 77 minutes.  The patient  spontaneously converted to a sinus rhythm.  A partial occlusion clamp was  placed on the ascending aorta.  Two punch aortotomies were performed.  Each  of the two vein grafts were anastomosed to the ascending aorta.  Air was  evacuated from the grafts and the partial occlusion clamp was removed.  Sites of anastomosis were inspected and free of bleeding.  With instigation  of ventilation, the patient's lungs began air-trapping and became enlarged but would not ventilate.  With this,  bronchodilators were administered.  The  patient was started on epinephrine drip.  The lungs were suctioned, and  Kaylyn Layer. Michelle Piper, M.D., performed bronchoscopy to remove any secretions.  With  a longer period on pump, these gradually resolved to some degree to the  point where we could wean the patient from bypass.  With an epinephrine  drip, low-dose dopamine, he was weaned from cardiopulmonary bypass and  remained stable with intra-aortic  balloon pump in place and functioning.  He  was decannulated in the usual fashions and protamine sulfate was  administered with the operative field hemostatic.  Two atrial and two  ventricular pacing wires were applied, graft markers were applied.  Both  pleuras were opened.  #28 chest tubes were left in both pleuras.  Two  mediastinal tubes were left in place.  The sternum was closed with #6  stainless steel wire.  Fascia closed with interrupted 0 Vicryl, running 3-0  Vicryl in the subcutaneous tissue, 4-0 subcuticular stitch in the skin  edges.  Dry dressings were applied.  Sponge and needle count was reported as  correct at completion of the procedure.  At the completion of the procedure  with the patient's ventilatory difficulty, we will obtain a pulmonary  consult to assist in the patient's ventilator management postop.  Total pump  time was 190 minutes.                                               Gwenith Daily Tyrone Sage, M.D.    Tyson Babinski  D:  02/18/2002  T:  02/19/2002  Job:  [id number]

## 2010-06-01 NOTE — Cardiovascular Report (Signed)
NAME:  Daniel Norton, Daniel Norton NO.:  [id number]   MEDICAL RECORD NO.:  [id number]                   PATIENT TYPE:  INP   LOCATION:  2929                                 FACILITY:  [facility]   PHYSICIAN:  Eduardo Osier. Sharyn Lull, M.D.              DATE OF BIRTH:  20-Jan-1950   DATE OF PROCEDURE:  02/18/2002  DATE OF DISCHARGE:                              CARDIAC CATHETERIZATION   PROCEDURES:  .  1. Left cardiac catheterization with selective left and right coronary     angiography, left ventriculography via the right groin using Judkins     technique.  2. Insertion of intra-aortic balloon pump via right groin.   INDICATIONS FOR PROCEDURE:  The patient is a 60 year old white male with a  past medical history significant for hypertension, tobacco abuse, positive  family history of coronary artery disease, who came to the ER via EMS  complaining of retrosternal chest pressure associated with nausea,  diaphoresis, and vomiting, which woke him up from sleep.  He called EMS,  received 4 baby aspirin and 3 sublingual nitroglycerin, and was started on  IV heparin and nitrates with partial relief of chest pain from grade 10/10  to 3/10.  ECG performed in the ER showed sinus bradycardia with ST elevation  in II, III, aVF and reciprocal ST depression in I, aVL and also ST  depression in V1 to V6 suggestive of inferoposterior post injury pattern.  Repeat ECG  performed showed marked improvement in ST elevation and ST  depression in the above leads.  The patient denies any such episodes of  chest pain in the past, but states has been feeling weak, tired, and  fatigued for the last month.   PAST MEDICAL HISTORY:  As above.   PAST SURGICAL HISTORY:  None.   ALLERGIES:  PENICILLIN.   SOCIAL HISTORY:  He is married.  Smokes two packs-per-day for 20+ years.  No  history of alcohol abuse. Born in [place], [place], lives in [place].   FAMILY HISTORY:  Positive for coronary  artery disease.  Father died of MI in  his 71s.   PHYSICAL EXAMINATION:  GENERAL:  On examination, he is alert, awake,  oriented x3 in no acute distress.  VITAL SIGNS:  Blood pressure is 124/70, pulse 58, regular.  HEENT:  Conjunctiva is pink.  NECK:  Supple, no JVD, no bruit.  LUNGS:  He has decreased breath sounds at bases with occasional rhonchi.  CARDIOVASCULAR:  S1 and S2 is soft.  There was no S3 gallop.  ABDOMEN:  Soft, bowel sounds are present, nontender.  EXTREMITY:  There is no clubbing, cyanosis or edema.   Discussed with patient and his wife regarding emergency left  catheterization, possible PTCA, stenting  its risks, i.e., death, MI,  stroke, need for emergency CABG, risk of restenosis, local vascular  complications, etc., and consented for PCI.   DESCRIPTION OF PROCEDURE:  After  obtaining the informed consent, the patient  was brought to the catheterization lab and was placed on the fluoroscopy  table.  The right groin was prepped and draped in the usual fashion.  Xylocaine 2% was used for local anesthesia in the right groin.  With the  help of thin wall needle, a 7 French arterial and 6 French venous sheaths  were placed.  Both the sheaths were aspirated and flushed.  Next, a 6 French  left Judkins catheter was advanced over the wire under fluoroscopic guidance  up to the ascending aorta.  The wire was pulled out, the catheter was  aspirated and connected to the manifold.  The catheter was further advanced  and engaged into the left coronary ostium.  Multiple views of the left  system were taken.  Next, the catheter was disengaged and was pulled out  over the wire and was replaced with a 6 French pigtail catheter, 6 French  right Judkins catheter, which was advanced over the wire under fluoroscopic  guidance up to the ascending.  The wire was pulled out.  The catheter was  aspirated and connected to the manifold.  The catheter was further advanced  and engaged into  the right coronary ostium.  Multiple views of the right  system were taken.  Next, catheter was disengaged and was pulled out and was  replaced with 6 French pigtail catheter, which was advanced over the wire  under fluoroscopic guidance up to the ascending aorta.  The wire was pulled  out.  The catheter was aspirated and connected to the manifold.  The  catheter was further advanced across the aortic valve into the LV. LV  pressures were recorded.  Next, LV graph was done in 30-degree RAO position.  Postangiographic pressures were recorded from LV and then pullback pressures  were recorded from the aorta.  There was no gradient across the aortic  valve.  Next, the pigtail catheter was pulled out over the wire, the sheaths  were aspirated and flushed.   FINDINGS:  1. LV showed good LV systolic function.  There was mild inferobasal and mid     wall hypokinesia with EF of 50% approximately.  2. Left main was patent.  3. LAD has 40-50% proximal stenosis and then is diffusely diseased beyond     midportion.  4. Diagonal #1 has 70-80% ostial stenosis.  5. Left circumflex has 60-70% bifurcation stenosis with OM-1.  OM-1 had 70%     ostial stenosis.  OM-2 is patent.  6. RCA has 99% mid stenosis and PDA had 29% stenosis at the ostium and     bifurcation with distal RCA with TIMI grade 3 distal flow.   Intra-aortic balloon pump was inserted at the end of the procedure without  complications.  The patient was pain-free at the end of the procedure.    PLAN:  The plan is to call CVTS consult for coronary artery bypass grafting  for complete revascularization.  Discussed with patient and his wife and they agreed for the procedure.                                                   Eduardo Osier. Sharyn Lull, M.D.    MNH/MEDQ  D:  02/18/2002  T:  02/18/2002  Job:  [id number]   cc:   Ricki Rodriguez,  M.D.  [address]  [place] [address]   Gwenith Daily. Tyrone Sage, M.D.  [address]   [place]  [place] [address]  Fax: [phone]   Cardiac Catheterization Laboratory

## 2010-06-01 NOTE — Discharge Summary (Signed)
   NAME:  Daniel Norton, Daniel Norton NO.:  [id number]   MEDICAL RECORD NO.:  [id number]                   PATIENT TYPE:  INP   LOCATION:  3739                                 FACILITY:  [facility]   PHYSICIAN:  Ollen Gross. Vernell Morgans, M.D.              DATE OF BIRTH:  September 04, 1950   DATE OF ADMISSION:  09/22/2002  DATE OF DISCHARGE:  10/01/2002                                 DISCHARGE SUMMARY   [name] is a 60 year old gentleman who was admitted on September 8 with  perforated sigmoid diverticulitis. He had severe abdominal pain, some  hypotension that responded to fluid, and free intra-abdominal air on CT  scan. He was taken to the operating room where he underwent a sigmoid  colectomy and colostomy. He had a known history of cardiac disease and  postoperatively was monitored in the ICU. He was kept on Cipro and Flagyl  postoperatively. The lower portion of his wound was left open for dressing  changes. Cardiology was consulted to follow his cardiac condition along with  [place] while he was in the postoperative time. About postoperative day #4, his  NG tube was discontinued, and he transferred from the ICU to a telemetry  bed. He still had some persistent postoperative ileus that slowly resolved,  and by September 17, his wound was clean. He was on the floor doing well,  tolerating a diet. His ostomy was working well, and he was ready for  discharge home.   MEDICATIONS AT DISCHARGE:  He is to resume his home medications, and he was  given a prescription for Vicodin for pain.   ACTIVITY:  No heavy lifting. He will get home health nurse and dressing  changes and routine ostomy care.   DIET:  As tolerated.   FINAL DIAGNOSIS:  Perforated sigmoid diverticulitis.   CONDITION ON DISCHARGE:  Stable.   FOLLOW UP:  Followup will be in two weeks to Dr. Carolynne Edouard.                                                Ollen Gross. Vernell Morgans, M.D.    PST/MEDQ  D:  10/20/2002  T:  10/20/2002   Job:  [id number]

## 2010-06-01 NOTE — Discharge Summary (Signed)
NAME:  Daniel Norton, Daniel Norton NO.:  [id number]   MEDICAL RECORD NO.:  [id number]                   PATIENT TYPE:  INP   LOCATION:  2002                                 FACILITY:  [facility]   PHYSICIAN:  Gwenith Daily. Tyrone Sage, M.D.            DATE OF BIRTH:  1950-08-16   DATE OF ADMISSION:  02/18/2002  DATE OF DISCHARGE:  02/24/2002                                 DISCHARGE SUMMARY   ADMISSION DIAGNOSIS:  Severe 3-vessel coronary artery disease with acute  inferior myocardial infarction.   PAST MEDICAL HISTORY:  1. Hypertension.  2. Tobacco use, two pack a day for 20 years.  3. Hiatal hernia with reflux symptoms.   ALLERGIES:  Questionable allergy to penicillin.  Allergic reaction to  penicillin in the past, although he says he has taken penicillin without  difficulty.   DISCHARGE DIAGNOSIS:  Severe 3-vessel coronary artery disease with acute  myocardial infarction, status post coronary artery bypass graft.   BRIEF HISTORY:  Daniel Norton is a 60 year old Caucasian man.  He awoke during  the evening of 2/5 with chest pain radiating to his arm, associated with  nausea, vomiting, shortness of breath.  EMS brought him to the [facility]  Emergency Department.  On admission, EKG revealed changes consistent with  acute inferior MI.  His cardiologist, Ricki Rodriguez, M.D., was consulted  and recommend proceeding with cardiac catheterization.  This was done in  emergent fashion and revealed severe disease in the right coronary artery as  well as concomitant disease of the LAD and circumflex arteries, ejection  fraction estimated to be 50%.  Intra-aortic balloon pump was inserted and  Daniel Norton was pain-free at the conclusion of the procedure.  Cardiac  surgery consultation was requested and the patient was evaluated by Ramon Dredge  B. Tyrone Sage, M.D.  The patient reviewed the catheterization films.  Dr.  Tyrone Sage agreed that the best approach would be to proceed with  coronary  artery bypass grafting since the patient is now hemodynamically stable with  intra-aortic balloon pump.  The patient's risks and benefits were discussed  with Daniel Norton and he agreed to proceed.   Daniel Norton underwent primary surgical procedure with Ramon Dredge B. Tyrone Sage,  M.D.  Urgent coronary artery bypass grafting x5.  Grafts placed at time of  procedure:  Left anterior mammary artery graft into the diagonal artery,  saphenous veins grafted in sequential fashion to the obtuse marginal and  distal circumflex arteries, saphenous veins grafted in sequential fashion to  the distal right coronary artery and posterior descending artery.  Vein was  harvested from the left side in the end to vein harvesting technique.  The  vein in the right side was not suitable for bypass.  He tolerated this  procedure reasonably well, was transferred in stable condition to the SICU.  He was not able to be extubated in the usual fashion, therefore, pulmonary  consult  was obtained.  Their impression was that of the respiratory failure  secondary to cardiogenic shock secondary to his MI.  The recommendation was  to continue mechanical ventilation and add Xopenex and Atrovent respiratory  medications.   On 2/6, postoperative day one, the intra-aortic balloon pump was  discontinued without incident.   On 2/7, postoperative day two, Daniel Norton's respiratory status was  improving and he was extubated successfully.   The remainder of Daniel Norton's hospital course has been uneventful.  He has  had some volume overload treated with Lasix and this was resolving very  well.  A smoking cessation consultation was completed on 2/9.  Daniel Norton  acknowledges that smoking is not an option for him any longer and he will be  followed by a smoking cessation counselor in the outpatient setting by  telephone.   The morning of 02/24/02, Daniel Norton reports feeling well.  His vital signs  are stable, blood  pressure 96/61.  He is afebrile.  His room air saturations  are 90%.  He is having normal sinus rhythm.  His lungs have decreased sounds  at the bases, no wheezes or crackles noted.  He is tolerating 58 to 100% of  his meals.  His bowel and bladder function within normal limits for him.  His incisions all healing well.  His left lower leg does have 1+ edema.  His  ambulation is improving and does require some assistance.  He does get out  of breath, but recovers quickly.  His foot control is adequate.  He is  sleeping fair.  Daniel Norton continues to make progress after his surgery, he  is tolerating new medications of beta blocker, ACE inhibitor, statin and  aspirin.  Daniel Norton is ready for discharge home this morning, 02/24/02.   His laboratory studies, 2/11:  Chemistries reveal a sodium 133, potassium  3.8, BUN 15, creatinine 0.8, glucose 117, calcium 8.1.  1/10:  CBC included  white blood cells 15.3, hemoglobin 8.9, hematocrit 26, platelets 393.   DISCHARGE CONDITION:  Improved.   DISCHARGE INSTRUCTIONS:  Medications:  Toprol XL 25 mg 1 p.o. daily, Altace  2.5 mg p.o. daily, Zocor 20 mg p.o. daily, Enteric coated aspirin 325 mg  p.o. daily, Lasix 40 mg p.o. daily x3 days, potassium chloride 20 mEq p.o.  daily x3 days, Colace 200 mg p.o. daily, folic acid 1 mg p.o. daily.  Instruction for him to follow home medications:  Protonix 40 mg p.o. daily,  Clarinex 5 mg p.o. daily, p.r.n.,  Tylox 1-2 p.o. q.4-6h. p.r.n. for pain.   ACTIVITY:  He has been asked to refrain from any driving, any heavy lifting,  pushing or pulling.  He is to continue his breathing exercises and daily  walking.  He is also reminded to refrain from smoking.   DIET:  Low fat, low salt diet.   WOUND CARE:  He may shower with mild soap and water.  If his incisions  become red, hot, or if he has a fever greater 101 degrees F., he is to call  Dr. Dennie Maizes office.   FOLLOWUP: 1. Ricki Rodriguez, M.D. would like  to see him in the office in     approximately two weeks.  He has been asked to call that office and     arrange for an appointment.  2.     He will have an appointment to see Dr. Tyrone Sage at his office in     approximately three weeks.  The office will call to give him time for     that appointment.  He will be asked to have a chest x-ray, CBC one hour     prior to that appointment.     Toribio Harbour, R.N.                  Gwenith Daily. Tyrone Sage, M.D.    CTK/MEDQ  D:  02/24/2002  T:  02/24/2002  Job:  [id number]   cc:   Ricki Rodriguez, M.D.  [address]  [place] [address]   Dr. Lambert Mody, [place]

## 2010-06-01 NOTE — Discharge Summary (Signed)
NAME:  Daniel Norton, Daniel Norton NO.:  [id number]   MEDICAL RECORD NO.:  [id number]                   PATIENT TYPE:  INP   LOCATION:  0370                                 FACILITY:  [facility]   PHYSICIAN:  Ollen Gross. Vernell Morgans, M.D.              DATE OF BIRTH:  [birth year]   DATE OF ADMISSION:  01/10/2003  DATE OF DISCHARGE:  01/17/2003                                 DISCHARGE SUMMARY   HISTORY OF PRESENT ILLNESS:  Daniel Norton is a 60 year old white male who had  a history of diverticulitis that required sigmoid colectomy and colostomy.  He was brought to the operating room on January 10, 2003, for colostomy  take down.  That operation was very difficult because of the amount of  inflammation of his abdominal wall from his old scar, but he tolerated this  well.  He was able to be primarily re anastomosed without difficulty.  His  postoperative course was complicated by some persistent ileus and some  hypertension.  He was covered with labetalol until he could take p.o., and  he was restarted on his Toprol and Norvasc.  His NG tube was going to be  discontinued on January 14, 2003.  His postoperative course was also  complicated by significant redness of his abdominal wall.  This really did  not improve until his wounds were opened in several places, not a tremendous  amount of purulence was expressed, but the redness quickly improved once  this was done.  He was continued on IV antibiotics for the rest of his  admission, and his bowel function gradually returned.  He was started on a  diet which he tolerated well, and by January 17, 2003, he was tolerating a  regular diet, his bowels were moving regularly, and his pain was under good  control with p.o. medications.  He was ready for discharge home.   FINAL DIAGNOSIS:  History of diverticulitis and colostomy take down.   MEDICATIONS:  1. He is to continue Cipro and Flagyl, as well as Vicodin for pain at home.  2. Resume  his home medications.   ACTIVITY:  No heavy lifting.   DIET:  As tolerated.   WOUND CARE:  Home health nursing dressing changes were arranged for home.   FOLLOWUP:  With Dr. Carolynne Edouard in the next week.   CONDITION ON DISCHARGE:  He is discharged home in stable condition.                                               Ollen Gross. Vernell Morgans, M.D.    PST/MEDQ  D:  01/31/2003  T:  01/31/2003  Job:  [id number]

## 2010-06-01 NOTE — Consult Note (Signed)
NAME:  Daniel Norton, Daniel Norton NO.:  [id number]   MEDICAL RECORD NO.:  [id number]                   PATIENT TYPE:  INP   LOCATION:  2929                                 FACILITY:  [facility]   PHYSICIAN:  Gwenith Daily. Tyrone Sage, M.D.            DATE OF BIRTH:  11-01-1950   DATE OF CONSULTATION:  02/18/2002  DATE OF DISCHARGE:                                   CONSULTATION   REFERRING CARDIOLOGIST:  Ricki Rodriguez, M.D.   PRIMARY CARE PHYSICIAN:  Blondell Reveal, M.D., Primary Care Medicine, [place].   REASON FOR CONSULTATION:  Emergency coronary artery disease bypass with  acute inferior myocardial infarction.   HISTORY OF PRESENT ILLNESS:  The patient is a 60 year old male who woke  during the night with chest pain radiating down his arm with nausea and  vomiting and shortness of breath, substernal chest pain, nonradiating.  He  was brought by EMS to the emergency room with EKG change of acute inferior  myocardial infarction.  He was heparinized and EKG change improved and pain  improved.  He is taken to the cath lab by Dr. Sharyn Lull and Dr. Algie Coffer.  Cardiac catheterization was performed which showed severe disease in the  right coronary artery but the vessel was open.  Also, concomitant disease in  the LAD of greater than 80-90% and 70-80% enlarged circumflex branch.  The  patient denies any previous cardiac history.  Does have a positive family  history with his father dying in his 37s of myocardial infarction.  Mother  is alive at age 92.  He is an only child.  He denies diabetes.  Does have a  history of hypertension, has smoked two packs a day for 20 years, occasional  alcohol use.  Denies elevated cholesterol.   CURRENT MEDICATIONS:  1. Protonix.  2. Avapro.  3. Clarinex.   ALLERGIES:  A vague allergy of PENICILLIN in the past though the patient  says he has taken penicillin in the past without difficulty.   SOCIAL HISTORY:  The patient works as a  Armed forces logistics/support/administrative officer, is married,  one daughter 79.   REVIEW OF SYSTEMS:  The patient does wear glasses.  Denies hemoptysis and  does have shortness of breath.  Has had gastric ulcer in the past.  All the  review of systems are negative.   PHYSICAL EXAMINATION:  GENERAL:  The patient is a 60 year old male with a  beard.  Awake and alert and neurologically intact in the Coronary Care Unit  with an intraaortic balloon pump in place.  Denies any current chest pain at  the present time.  VITAL SIGNS:  Temperature 98, pulse 60, respiratory rate 16, blood pressure  111/76.  Height 5 feet 10 inches tall, 180 pounds.  NECK:  Without jugular vein distention or carotid bruits.  LUNGS:  Distant breath sounds with occasional rhonchi.  CARDIAC:  Regular rate and  rhythm without murmur.  ABDOMEN:  Soft.  EXTREMITIES:  In the right groin there is an intraaortic balloon pump.  He  has palpable pulses, PTP and DP bilaterally.   LABORATORY DATA:  Hematocrit 52, hemoglobin 18, creatinine 1.2.  CK-MBs are  minimally elevated, follow up are pending.   DIAGNOSTIC DATA:  Serial EKGs show evidence of acute inferior myocardial  infarction which over a short period of time resolved to near complete  normalization of his EKG.  Cardiac catheterization films were reviewed with  Dr. Sharyn Lull and are outlined as above.  Left ventricular function shows  approximately 45% ejection fraction with movement of the inferior wall.  The  patient has been given Plavix and is currently on Integrilin drip and  heparin.   IMPRESSION:  Acute inferior myocardial infarction with stabilization of  critical three-vessel disease with intraaortic balloon pump and  anticoagulation.  With the diffuse nature of his disease, I agree with  cardiology that the best approach would be to proceed with coronary artery  bypass grafting with the patient now hemodynamically stable with intraaortic  balloon pump.  Will plan to proceed on an  urgent basis.  The risks and  options including death, infection, stroke, myocardial infarction, bleeding  with blood transfusion have all been discussed with the patient and his wife  and he is going to proceed.  The operating room is making arrangements to  proceed with surgery immediately as soon as we dictate this note.                                               Gwenith Daily Tyrone Sage, M.D.    Tyson Babinski  D:  02/18/2002  T:  02/18/2002  Job:  [id number]

## 2010-06-01 NOTE — Discharge Summary (Signed)
NAMEMICA, RELEFORD                ACCOUNT NO.:  [id number]   MEDICAL RECORD NO.:  [id number]          PATIENT TYPE:  INP   LOCATION:  5713                         FACILITY:  [facility]   PHYSICIAN:  Ollen Gross. Vernell Morgans, M.D. DATE OF BIRTH:  1950/10/27   DATE OF ADMISSION:  09/12/2004  DATE OF DISCHARGE:  09/24/2004                                 DISCHARGE SUMMARY   Mr. Corkery is a 60 year old gentleman who has had multiple previous  abdominal operations for a perforated sigmoid colon and then for a  subsequent colostomy takedown.  Each time he had some fascial dehiscence and  chronic wounds associated with this.  He was brought to the operating room  August 30 for a ventral hernia repair.  He tolerated the surgery fairly  well.  He had a piece of PROCEED mesh used to repair his abdominal wall that  was placed intra-abdominally and then had healthy fascia reapproximate on  top of this.  He also had a scar removed from the skin and then the skin  reapproximate as well.  Postoperatively initially he had some problems with  ileus which gradually resolved.  He also had some separation of his skin.  When his skin separated he had some exposure of the wound underneath with  some slight separation of the fascia covering the mesh.  We were able to  successfully treat this so far with a vacuum dressing to promote granulation  tissue over top of the mesh to try to save the mesh and he has tolerated  this very well.  He is now ready for discharge home.   DISCHARGE MEDICATIONS:  He is to resume his home medications.  He was given  a prescription for Darvocet for pain.   ACTIVITY:  No heavy lifting.   DIET:  As tolerated.   CONDITION ON DISCHARGE:  Stable.   FINAL DIAGNOSES:  Recurrent ventral hernia.   Once patient is approved for his home vacuum dressing we will have him  discharged home and follow up with [place] within the next week for a wound  check.      Ollen Gross. Vernell Morgans, M.D.  Electronically Signed     PST/MEDQ  D:  09/24/2004  T:  09/24/2004  Job:  [id number]

## 2010-06-01 NOTE — H&P (Signed)
NAME:  ENRRIQUE, MIERZWA NO.:  [id number]   MEDICAL RECORD NO.:  [id number]                   PATIENT TYPE:  EMS   LOCATION:  [facility]                                 FACILITY:  [facility]   PHYSICIAN:  Ollen Gross. Vernell Morgans, M.D.              DATE OF BIRTH:  11/05/1950   DATE OF ADMISSION:  09/22/2002  DATE OF DISCHARGE:                                HISTORY & PHYSICAL   HISTORY OF PRESENT ILLNESS:  The patient is a 60 year old white male with a  significant cardiac history, who presents to the emergency department today  with nine days worth of lower abdominal pain and actually saw his regular  medical doctor last week for this, who started him on oral Cipro.  He  initially improved, but over the last two days has felt worsening abdominal  pain. He has not had any nausea or vomiting associated with this. His bowels  have been moving fairly regularly and have not been out of the ordinary. He  did have a fever on arrival to the emergency department of 101.8.  He has  had no real shortness of breath or chest pain. He does not use any  nitroglycerin at home. His other review of systems is unremarkable.   PAST MEDICAL HISTORY:  Significant for coronary artery disease with MI in  February of 2004 and gastroesophageal reflux disease.   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   MEDICATIONS:  Plavix, Protonix 40 mg a day, Clarinex, Toprol XL 25 mg a day,  and Zocor.   ALLERGIES:  PENICILLIN.   SOCIAL HISTORY:  He quit smoking in February of 2004.  Prior to that he  smoked two packs a day for 20 years. He only occasionally drinks any  alcohol.   FAMILY HISTORY:  Significant for colon cancer in his father at the age of  70.   PHYSICAL EXAMINATION:  VITAL SIGNS: Temperature 101.8, blood pressure  129/87, pulse 100, O2 saturation 96%.  GENERAL: He is a well-developed, well-nourished, white male in no acute  distress.  SKIN:  Warm and dry with no jaundice.  HEENT:  Extraocular muscles intact.  Pupils equal, round, and reactive to  light. Sclerae nonicteric.  LUNGS:  He has distant breath sounds, but they are clear bilaterally with  very mild use of accessory respiratory muscles. He states that his breathing  has actually improved since he quit smoking in February.  HEART:  Regular rate and rhythm with a pulse in the left chest.  ABDOMEN: Soft with some moderate to severe suprapubic tenderness, no  peritonitis, no palpable mass, no hepatosplenomegaly.  EXTREMITIES:  No cyanosis, clubbing, or edema.  He does have some mottling  of his lower extremities, but he has good distal pulses and his family says  that is the way his legs appear all the time. He has 2+  dorsalis pedis  pulses bilaterally.  PSYCHOLOGIC: Alert and oriented x3 with no evidence of anxiety or  depression.   LABORATORY DATA:  His UA was negative. His white count was 14,700,  hemoglobin 15.6, hematocrit 46.6, platelet count 334,000, 75% segs, 15%  lymphs, 7% monos.  His sodium 138, potassium 4.3, chloride 105, CO2 27, BUN  14, creatinine 1.0, glucose 96.  Liver functions are normal. Amylase was  normal.   On reviewing the CT scan, he has significant sigmoid diverticulitis with  some air in his retroperitoneum and mesentery. He has no free fluid in his  abdomen. No leakage of contrast from his colon on CT scan.   ASSESSMENT:  This is a 60 year old white male with a significant cardiac  history, who has sigmoid diverticulitis with some evidence of contained  perforation into his retroperitoneum. He is hemodynamically stable and does  not have any signs of peritonitis. We will plan to admit him for bowel rest  with no signs of peritonitis. Because of his physical findings, there is a  chance he may improve on broad spectrum antibiotic therapy without requiring  acutely a colostomy. We will plan to admit him for bowel rest, IV fluid  hydration,  and broad spectrum antibiotic coverage. We will monitor him  closely and I have asked his cardiologist, Ricki Rodriguez, M.D. to follow  him along with [place]. If he does not improve, then he will require colectomy  and colostomy. I have discussed this with him and his family. They  understand and agree with this treatment plan.                                                Ollen Gross. Vernell Morgans, M.D.    PST/MEDQ  D:  09/22/2002  T:  09/22/2002  Job:  [id number]

## 2010-08-17 ENCOUNTER — Telehealth (INDEPENDENT_AMBULATORY_CARE_PROVIDER_SITE_OTHER): Payer: Self-pay

## 2010-08-17 NOTE — Telephone Encounter (Signed)
Called pt to advise him that Dr Carolynne Edouard wanted him on Bactrim DS #28 BID x 14 days phoned to [facility] [phone] per DR Carolynne Edouard. Pt advised to call if redness at the incision continues after taking antibiotics/[facility]

## 2010-10-03 ENCOUNTER — Telehealth (INDEPENDENT_AMBULATORY_CARE_PROVIDER_SITE_OTHER): Payer: Self-pay | Admitting: General Surgery

## 2010-10-03 NOTE — Telephone Encounter (Signed)
MR Lauritsen WAS SCHEDULED TO SEE DR. TOTH ON 10-11-10.

## 2010-10-11 ENCOUNTER — Encounter (INDEPENDENT_AMBULATORY_CARE_PROVIDER_SITE_OTHER): Payer: Self-pay | Admitting: General Surgery

## 2010-10-11 ENCOUNTER — Ambulatory Visit (INDEPENDENT_AMBULATORY_CARE_PROVIDER_SITE_OTHER): Payer: BC Managed Care – PPO | Admitting: General Surgery

## 2010-10-11 VITALS — BP 148/88 | HR 72 | Temp 98.4°F | Resp 18 | Ht 70.0 in | Wt 210.0 lb

## 2010-10-11 DIAGNOSIS — S31109A Unspecified open wound of abdominal wall, unspecified quadrant without penetration into peritoneal cavity, initial encounter: Secondary | ICD-10-CM

## 2010-10-11 MED ORDER — MUPIROCIN CALCIUM 2 % EX CREA: TOPICAL_CREAM | CUTANEOUS | Status: DC

## 2010-10-11 NOTE — Progress Notes (Signed)
Subjective:     Patient ID: Daniel Norton, male   DOB: Aug 11, 1950, 60 y.o.   MRN: [id number]  HPI The patient is a 60 year old white male who is a long-term patient of mine. He is said several abdominal operations for perforated diverticulitis originally. He had an ostomy that was then subsequently taken down. He also developed a ventral hernia that was repaired with mesh. 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 All this is stable now but he does have a small area of exposed mesh on his anterior abdominal wall. This will occasionally get a little bit red but will usually respond to antibiotics. He has had another small area get raw recently although there is minimal redness. Review of Systems  Constitutional: Negative.   HENT: Negative.   Eyes: Negative.   Respiratory: Negative.   Cardiovascular: Negative.   Gastrointestinal: Negative.   Genitourinary: Negative.   Musculoskeletal: Negative.   Skin: Negative.   Neurological: Negative.   Hematological: Negative.   Psychiatric/Behavioral: Negative.        Objective:   Physical Exam  Constitutional: He is oriented to person, place, and time. He appears well-developed and well-nourished.  HENT:  Head: Normocephalic and atraumatic.  Eyes: Conjunctivae and EOM are normal. Pupils are equal, round, and reactive to light.  Neck: Normal range of motion. Neck supple.  Cardiovascular: Normal rate, regular rhythm and normal heart sounds.   Pulmonary/Chest: Effort normal and breath sounds normal.  Abdominal: Soft. Bowel sounds are normal.       He has a small scab centrally overlying the edge of some exposed mesh. I debrided this back to clean tissue.  Musculoskeletal: Normal range of motion.  Neurological: He is alert and oriented to person, place, and time.  Skin: Skin is warm and dry.    Psychiatric: He has a normal mood and affect. His behavior is normal.       Assessment:     Small chronic open area with exposed mesh    Plan:     I debrided this back to what looks clean today. I will start him on some mupirocin ointment to the area twice a day and plan to see him back in the next couple weeks.

## 2010-10-11 NOTE — Patient Instructions (Signed)
Use ointment on abdomen twice a day

## 2010-10-15 LAB — PROTIME-INR
INR: 1
Prothrombin Time: 13.8

## 2010-10-15 LAB — CBC
HCT: 48.2
Platelets: 502 — ABNORMAL HIGH
WBC: 9.2

## 2010-10-15 LAB — CULTURE, ROUTINE-ABSCESS

## 2010-10-22 ENCOUNTER — Encounter (INDEPENDENT_AMBULATORY_CARE_PROVIDER_SITE_OTHER): Payer: Self-pay | Admitting: General Surgery

## 2010-10-22 ENCOUNTER — Ambulatory Visit (INDEPENDENT_AMBULATORY_CARE_PROVIDER_SITE_OTHER): Payer: BC Managed Care – PPO | Admitting: General Surgery

## 2010-10-22 VITALS — BP 120/74 | HR 73 | Temp 97.8°F | Resp 12 | Ht 70.0 in | Wt 211.8 lb

## 2010-10-22 DIAGNOSIS — L089 Local infection of the skin and subcutaneous tissue, unspecified: Secondary | ICD-10-CM | POA: Insufficient documentation

## 2010-10-22 DIAGNOSIS — IMO0002 Reserved for concepts with insufficient information to code with codable children: Secondary | ICD-10-CM

## 2010-10-22 LAB — CULTURE, ROUTINE-ABSCESS

## 2010-10-22 NOTE — Progress Notes (Signed)
Subjective:     Patient ID: Daniel Norton, male   DOB: Aug 14, 1950, 60 y.o.   MRN: [id number]  HPI The patient is a 60 year old white male who is normal patient of mine. He has a chronically infected mesh that occasionally breaks through his anterior abdominal wall. He has had multiple normal surgeries. We saw him a couple weeks ago and trimmed some of the mesh and started him on some mupirocin ointment to the area. He is definitely improved. He's not having any pain. He's not having any drainage.  Review of Systems     Objective:   Physical Exam On exam his abdomen is soft and nontender. The raw area is definitely improved and smaller. The redness is much less.   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    Plan:     At this point I would continue the mupirocin ointment for a couple more weeks or while the area is raw appearing. He will call us in the next few weeks if that does not continue to improve.

## 2010-10-22 NOTE — Patient Instructions (Signed)
Continue cream for a couple more weeks or while area looks raw

## 2010-10-23 LAB — ANAEROBIC CULTURE

## 2010-10-23 LAB — CBC
Platelets: 478 — ABNORMAL HIGH
WBC: 16.7 — ABNORMAL HIGH

## 2010-10-23 LAB — CULTURE, ROUTINE-ABSCESS

## 2010-12-24 ENCOUNTER — Emergency Department (HOSPITAL_COMMUNITY): Payer: Medicare Other

## 2010-12-24 ENCOUNTER — Encounter (HOSPITAL_COMMUNITY): Payer: Self-pay | Admitting: Internal Medicine

## 2010-12-24 ENCOUNTER — Inpatient Hospital Stay (HOSPITAL_COMMUNITY)
Admission: EM | Admit: 2010-12-24 | Discharge: 2010-12-27 | DRG: 193 | Disposition: A | Discharge status: Home / Self Care | Payer: Medicare Other | Attending: Internal Medicine | Admitting: Internal Medicine

## 2010-12-24 ENCOUNTER — Other Ambulatory Visit: Payer: Self-pay

## 2010-12-24 DIAGNOSIS — J9600 Acute respiratory failure, unspecified whether with hypoxia or hypercapnia: Secondary | ICD-10-CM

## 2010-12-24 DIAGNOSIS — I2510 Atherosclerotic heart disease of native coronary artery without angina pectoris: Secondary | ICD-10-CM

## 2010-12-24 DIAGNOSIS — Z87891 Personal history of nicotine dependence: Secondary | ICD-10-CM

## 2010-12-24 DIAGNOSIS — J441 Chronic obstructive pulmonary disease with (acute) exacerbation: Secondary | ICD-10-CM | POA: Diagnosis present

## 2010-12-24 DIAGNOSIS — J449 Chronic obstructive pulmonary disease, unspecified: Secondary | ICD-10-CM

## 2010-12-24 DIAGNOSIS — I959 Hypotension, unspecified: Secondary | ICD-10-CM | POA: Diagnosis present

## 2010-12-24 DIAGNOSIS — J111 Influenza due to unidentified influenza virus with other respiratory manifestations: Secondary | ICD-10-CM | POA: Diagnosis present

## 2010-12-24 DIAGNOSIS — Z951 Presence of aortocoronary bypass graft: Secondary | ICD-10-CM

## 2010-12-24 DIAGNOSIS — I10 Essential (primary) hypertension: Secondary | ICD-10-CM | POA: Diagnosis present

## 2010-12-24 DIAGNOSIS — Y849 Medical procedure, unspecified as the cause of abnormal reaction of the patient, or of later complication, without mention of misadventure at the time of the procedure: Secondary | ICD-10-CM | POA: Diagnosis present

## 2010-12-24 DIAGNOSIS — J13 Pneumonia due to Streptococcus pneumoniae: Secondary | ICD-10-CM

## 2010-12-24 DIAGNOSIS — Z79899 Other long term (current) drug therapy: Secondary | ICD-10-CM

## 2010-12-24 DIAGNOSIS — J069 Acute upper respiratory infection, unspecified: Secondary | ICD-10-CM

## 2010-12-24 DIAGNOSIS — E785 Hyperlipidemia, unspecified: Secondary | ICD-10-CM | POA: Diagnosis present

## 2010-12-24 DIAGNOSIS — B349 Viral infection, unspecified: Secondary | ICD-10-CM

## 2010-12-24 DIAGNOSIS — J1189 Influenza due to unidentified influenza virus with other manifestations: Secondary | ICD-10-CM

## 2010-12-24 DIAGNOSIS — IMO0002 Reserved for concepts with insufficient information to code with codable children: Secondary | ICD-10-CM

## 2010-12-24 DIAGNOSIS — M129 Arthropathy, unspecified: Secondary | ICD-10-CM | POA: Diagnosis present

## 2010-12-24 DIAGNOSIS — R6510 Systemic inflammatory response syndrome (SIRS) of non-infectious origin without acute organ dysfunction: Secondary | ICD-10-CM | POA: Diagnosis present

## 2010-12-24 DIAGNOSIS — Z8614 Personal history of Methicillin resistant Staphylococcus aureus infection: Secondary | ICD-10-CM

## 2010-12-24 DIAGNOSIS — M1990 Unspecified osteoarthritis, unspecified site: Secondary | ICD-10-CM | POA: Insufficient documentation

## 2010-12-24 DIAGNOSIS — T8189XA Other complications of procedures, not elsewhere classified, initial encounter: Secondary | ICD-10-CM | POA: Diagnosis present

## 2010-12-24 DIAGNOSIS — J154 Pneumonia due to other streptococci: Principal | ICD-10-CM

## 2010-12-24 HISTORY — DX: Presence of aortocoronary bypass graft: Z95.1

## 2010-12-24 HISTORY — DX: Personal history of nicotine dependence: Z87.891

## 2010-12-24 HISTORY — DX: Essential (primary) hypertension: I10

## 2010-12-24 HISTORY — DX: Chronic obstructive pulmonary disease, unspecified: J44.9

## 2010-12-24 LAB — COMPREHENSIVE METABOLIC PANEL
AST: 38 U/L — ABNORMAL HIGH (ref 0–37)
Alkaline Phosphatase: 52 U/L (ref 39–117)
BUN: 26 mg/dL — ABNORMAL HIGH (ref 6–23)
CO2: 30 mEq/L (ref 19–32)
Chloride: 95 mEq/L — ABNORMAL LOW (ref 96–112)
Creatinine, Ser: 1.14 mg/dL (ref 0.50–1.35)
GFR calc non Af Amer: 69 mL/min — ABNORMAL LOW (ref >90)
Potassium: 3.9 mEq/L (ref 3.5–5.1)
Total Bilirubin: 0.5 mg/dL (ref 0.3–1.2)

## 2010-12-24 LAB — MRSA PCR SCREENING: MRSA by PCR: NEGATIVE

## 2010-12-24 LAB — CREATININE, SERUM
Creatinine, Ser: 1.04 mg/dL (ref 0.50–1.35)
GFR calc Af Amer: 90 mL/min — ABNORMAL LOW (ref >90)

## 2010-12-24 LAB — CARDIAC PANEL(CRET KIN+CKTOT+MB+TROPI)
CK, MB: 4.9 ng/mL — ABNORMAL HIGH (ref 0.3–4.0)
CK, MB: 10.5 ng/mL (ref 0.3–4.0)
Relative Index: 1.1 (ref 0.0–2.5)
Total CK: 915 U/L — ABNORMAL HIGH (ref 7–232)
Troponin I: <0.3 ng/mL (ref <0.30)

## 2010-12-24 LAB — DIFFERENTIAL
Basophils Absolute: 0 10*3/uL (ref 0.0–0.1)
Lymphocytes Relative: 3 % — ABNORMAL LOW (ref 12–46)
Monocytes Absolute: 0.6 10*3/uL (ref 0.1–1.0)
Monocytes Relative: 4 % (ref 3–12)
Neutro Abs: 14.6 10*3/uL — ABNORMAL HIGH (ref 1.7–7.7)

## 2010-12-24 LAB — HEMOGLOBIN A1C
Hgb A1c MFr Bld: 6.3 % — ABNORMAL HIGH (ref <5.7)
Mean Plasma Glucose: 134 mg/dL — ABNORMAL HIGH (ref <117)

## 2010-12-24 LAB — PROCALCITONIN: Procalcitonin: 11.06 ng/mL

## 2010-12-24 LAB — LACTIC ACID, PLASMA: Lactic Acid, Venous: 2.7 mmol/L — ABNORMAL HIGH (ref 0.5–2.2)

## 2010-12-24 LAB — CBC
HCT: 48.6 % (ref 39.0–52.0)
Hemoglobin: 16.4 g/dL (ref 13.0–17.0)
Hemoglobin: 15.8 g/dL (ref 13.0–17.0)
MCH: 31 pg (ref 26.0–34.0)
MCV: 89.4 fL (ref 78.0–100.0)
Platelets: 178 10*3/uL (ref 150–400)
RBC: 5.1 MIL/uL (ref 4.22–5.81)
WBC: 15.7 10*3/uL — ABNORMAL HIGH (ref 4.0–10.5)
WBC: 13.8 10*3/uL — ABNORMAL HIGH (ref 4.0–10.5)

## 2010-12-24 LAB — PRO B NATRIURETIC PEPTIDE: Pro B Natriuretic peptide (BNP): 578.4 pg/mL — ABNORMAL HIGH (ref 0–125)

## 2010-12-24 MED ORDER — HEPARIN SODIUM (PORCINE) 5000 UNIT/ML IJ SOLN
5000.0000 [IU] | Freq: Three times a day (TID) | INTRAMUSCULAR | Status: DC
  Administered 2010-12-24 – 2010-12-27 (×8): 5000 [IU] via SUBCUTANEOUS
  Filled 2010-12-24 (×11): qty 1

## 2010-12-24 MED ORDER — LEVOFLOXACIN IN D5W 750 MG/150ML IV SOLN
750.0000 mg | INTRAVENOUS | Status: DC
  Administered 2010-12-25: 750 mg via INTRAVENOUS
  Filled 2010-12-24 (×2): qty 150

## 2010-12-24 MED ORDER — METHYLPREDNISOLONE SODIUM SUCC 125 MG IJ SOLR: 60.0000 mg | Freq: Three times a day (TID) | INTRAMUSCULAR | Status: DC

## 2010-12-24 MED ORDER — ACETAMINOPHEN 325 MG PO TABS
650.0000 mg | ORAL_TABLET | Freq: Once | ORAL | Status: AC
  Administered 2010-12-24: 650 mg via ORAL
  Filled 2010-12-24: qty 2

## 2010-12-24 MED ORDER — SODIUM CHLORIDE 0.9 % IV SOLN
500.0000 mg | Freq: Three times a day (TID) | INTRAVENOUS | Status: DC
  Filled 2010-12-24 (×2): qty 500

## 2010-12-24 MED ORDER — ALBUTEROL SULFATE (5 MG/ML) 0.5% IN NEBU
2.5000 mg | INHALATION_SOLUTION | RESPIRATORY_TRACT | Status: DC | PRN
  Filled 2010-12-24: qty 0.5

## 2010-12-24 MED ORDER — SODIUM CHLORIDE 0.9 % IV SOLN
INTRAVENOUS | Status: DC
  Administered 2010-12-24: 18:00:00 via INTRAVENOUS

## 2010-12-24 MED ORDER — SODIUM CHLORIDE 0.9 % IV SOLN
500.0000 mg | Freq: Once | INTRAVENOUS | Status: DC
  Filled 2010-12-24: qty 500

## 2010-12-24 MED ORDER — SODIUM CHLORIDE 0.9 % IV SOLN: INTRAVENOUS | Status: AC

## 2010-12-24 MED ORDER — METOPROLOL TARTRATE 25 MG PO TABS
25.0000 mg | ORAL_TABLET | Freq: Two times a day (BID) | ORAL | Status: DC
  Administered 2010-12-24 – 2010-12-27 (×6): 25 mg via ORAL
  Filled 2010-12-24 (×7): qty 1

## 2010-12-24 MED ORDER — SODIUM CHLORIDE 0.9 % IV SOLN
INTRAVENOUS | Status: AC
  Administered 2010-12-24: 17:00:00 via INTRAVENOUS

## 2010-12-24 MED ORDER — LEVOFLOXACIN IN D5W 750 MG/150ML IV SOLN
750.0000 mg | INTRAVENOUS | Status: DC
  Filled 2010-12-24: qty 150

## 2010-12-24 MED ORDER — BUDESONIDE 0.25 MG/2ML IN SUSP
0.2500 mg | Freq: Four times a day (QID) | RESPIRATORY_TRACT | Status: DC
  Administered 2010-12-24 – 2010-12-27 (×7): 0.25 mg via RESPIRATORY_TRACT
  Filled 2010-12-24 (×16): qty 2

## 2010-12-24 MED ORDER — ALBUTEROL SULFATE (5 MG/ML) 0.5% IN NEBU
2.5000 mg | INHALATION_SOLUTION | Freq: Four times a day (QID) | RESPIRATORY_TRACT | Status: DC
  Administered 2010-12-24 – 2010-12-27 (×9): 2.5 mg via RESPIRATORY_TRACT
  Filled 2010-12-24 (×8): qty 0.5

## 2010-12-24 MED ORDER — SODIUM CHLORIDE 0.9 % IV BOLUS (SEPSIS)
1000.0000 mL | Freq: Once | INTRAVENOUS | Status: AC
  Administered 2010-12-24: 1000 mL via INTRAVENOUS

## 2010-12-24 MED ORDER — ROSUVASTATIN CALCIUM 5 MG PO TABS
5.0000 mg | ORAL_TABLET | Freq: Every day | ORAL | Status: DC
  Administered 2010-12-24 – 2010-12-26 (×3): 5 mg via ORAL
  Filled 2010-12-24 (×4): qty 1

## 2010-12-24 MED ORDER — IPRATROPIUM BROMIDE 0.02 % IN SOLN
0.5000 mg | Freq: Four times a day (QID) | RESPIRATORY_TRACT | Status: DC
  Administered 2010-12-24 – 2010-12-27 (×9): 0.5 mg via RESPIRATORY_TRACT
  Filled 2010-12-24 (×9): qty 2.5

## 2010-12-24 MED ORDER — ALBUTEROL SULFATE (5 MG/ML) 0.5% IN NEBU
INHALATION_SOLUTION | RESPIRATORY_TRACT | Status: AC
  Filled 2010-12-24: qty 0.5

## 2010-12-24 MED ORDER — ALBUTEROL SULFATE (5 MG/ML) 0.5% IN NEBU: 2.5000 mg | INHALATION_SOLUTION | RESPIRATORY_TRACT | Status: DC | PRN

## 2010-12-24 MED ORDER — ALBUTEROL SULFATE (5 MG/ML) 0.5% IN NEBU
5.0000 mg | INHALATION_SOLUTION | Freq: Once | RESPIRATORY_TRACT | Status: AC
  Administered 2010-12-24: 5 mg via RESPIRATORY_TRACT
  Filled 2010-12-24: qty 0.5

## 2010-12-24 MED ORDER — MUPIROCIN 2 % EX OINT
TOPICAL_OINTMENT | Freq: Three times a day (TID) | CUTANEOUS | Status: DC
  Administered 2010-12-24 – 2010-12-27 (×8): via TOPICAL
  Filled 2010-12-24: qty 22

## 2010-12-24 MED ORDER — ASPIRIN EC 81 MG PO TBEC
81.0000 mg | DELAYED_RELEASE_TABLET | Freq: Every day | ORAL | Status: DC
Start: 2010-12-24 — End: 2010-12-27
  Administered 2010-12-24 – 2010-12-27 (×4): 81 mg via ORAL
  Filled 2010-12-24 (×4): qty 1

## 2010-12-24 MED ORDER — ALBUTEROL SULFATE (5 MG/ML) 0.5% IN NEBU
2.5000 mg | INHALATION_SOLUTION | Freq: Four times a day (QID) | RESPIRATORY_TRACT | Status: DC
  Administered 2010-12-24: 2.5 mg via RESPIRATORY_TRACT
  Filled 2010-12-24: qty 0.5

## 2010-12-24 MED ORDER — INSULIN ASPART 100 UNIT/ML ~~LOC~~ SOLN
0.0000 [IU] | Freq: Three times a day (TID) | SUBCUTANEOUS | Status: DC
Start: 2010-12-25 — End: 2010-12-25
  Administered 2010-12-25: 1 [IU] via SUBCUTANEOUS
  Filled 2010-12-24: qty 3

## 2010-12-24 MED ORDER — IPRATROPIUM BROMIDE 0.02 % IN SOLN
0.5000 mg | Freq: Once | RESPIRATORY_TRACT | Status: AC
  Administered 2010-12-24: 0.5 mg via RESPIRATORY_TRACT
  Filled 2010-12-24: qty 2.5

## 2010-12-24 MED ORDER — LEVOFLOXACIN IN D5W 750 MG/150ML IV SOLN
750.0000 mg | INTRAVENOUS | Status: AC
  Administered 2010-12-24: 750 mg via INTRAVENOUS
  Filled 2010-12-24: qty 150

## 2010-12-24 MED ORDER — VANCOMYCIN HCL 1000 MG IV SOLR
1250.0000 mg | Freq: Two times a day (BID) | INTRAVENOUS | Status: DC
  Administered 2010-12-25 – 2010-12-26 (×2): 1250 mg via INTRAVENOUS
  Filled 2010-12-24 (×5): qty 1250

## 2010-12-24 MED ORDER — VANCOMYCIN HCL 1000 MG IV SOLR
1250.0000 mg | Freq: Once | INTRAVENOUS | Status: AC
  Administered 2010-12-25: 1250 mg via INTRAVENOUS
  Filled 2010-12-24: qty 1250

## 2010-12-24 MED ORDER — THERA M PLUS PO TABS
1.0000 | ORAL_TABLET | Freq: Every day | ORAL | Status: DC
  Administered 2010-12-25 – 2010-12-27 (×3): 1 via ORAL
  Filled 2010-12-24 (×3): qty 1

## 2010-12-24 MED ORDER — MUPIROCIN CALCIUM 2 % EX CREA
1.0000 "application " | TOPICAL_CREAM | Freq: Three times a day (TID) | CUTANEOUS | Status: DC
  Administered 2010-12-24 – 2010-12-26 (×5): 1 via TOPICAL
  Filled 2010-12-24: qty 15

## 2010-12-24 NOTE — Consult Note (Signed)
PULMONARY/CCM CONSULT NOTE  Requesting MD/Service: Lamar Blinks Date of admission:  12/10 Date of consult: 12/10 Reason for consultation: Acute resp distress, febrile illness   Pt Profile:  60 yowm adm with 3 day history of fever, chest congestion, dyspnea. Admitted with Dx of SIRS, AECOPD by [facility] service   HPI: 33 yowm was in [facility] until approx 3-4 days ago when he developed fever, dyspnea and chest congestion. Seen @ [facility] urgent care center 12/7 and had nasal swab negative for influenza virus. Over next 2 days, he had persistent malaise, fever, myalgias, cough, chest congestion. Seen by Dr Lysbeth Galas in [place] (his primary care MD) and was noted to have very high fever (reportedly 105). Sent to [facility] ED to eval for acute flu and treat for acute respiratory distress. In the ED @ [facility], he was noted to initially be markedly dyspneic which improved substantially with nebulized BDs. He is admitted by [facility] who have requested PCCM consult for the above. Presently, he reports feeling much better. He admits to CP attributed to vigorous coughing. He denies pleurodynia, abd pain, N/V/D, dysuria, LE edema, calf tenderness, hemoptysis.  Past Medical History  Diagnosis Date  . Arthritis   . MRSA (methicillin resistant Staphylococcus aureus)   . Cellulitis of abdominal wall   . Walking pneumonia 06/29/2009  . Mass     recurrent chronically infected masses   . Diverticulitis   . Coronary artery disease   . COPD (chronic obstructive pulmonary disease)   . Smoking hx   . S/P CABG x 5   . Hypertension     MEDICATIONS: Home meds reviewed  History   Social History  . Marital Status: Married    Spouse Name: N/A    Number of Children: N/A  . Years of Education: N/A   Occupational History  . Not on file.   Social History Main Topics  . Smoking status: Former Games developer  . Smokeless tobacco: Not on file  . Alcohol Use: Yes  . Drug Use: No  . Sexually Active:    Other Topics Concern  . Not on  file   Social History Narrative  . No narrative on file    No family history on file.  ROS - as per HPI. [place], detailed ROS is negative  Filed Vitals:   12/24/10 1246 12/24/10 1301 12/24/10 1420 12/24/10 1500  BP: 103/67 113/67    Pulse: 120 109    Temp: 103.2 F (39.6 C)  102.5 F (39.2 C)   TempSrc: Oral  Oral   Resp:  30    Height:    5\' 10"  (1.778 m)  Weight:    98.431 kg (217 lb)  SpO2: 84% 96%      EXAM:  Gen: Disheveled, acutely ill appearing, no overt resp distress HEENT:  WNL Neck:  No JVD Lungs: diffusely diminished BS, few scattered wheezes, RLL rhonchi Cardiovascular: RRR s M Abdomen:  Old surgical wounds noted - erythematous. Abd soft, NT, + BS Musculoskeletal:  No C/C/E Neuro: CNs intact, motor intact, sens intact, DTRs symmetric Skin:  No rashes or lesions noted  DATA: BMET    Component Value Date/Time   NA 135 12/24/2010 1342   K 3.9 12/24/2010 1342   CL 95* 12/24/2010 1342   CO2 30 12/24/2010 1342   GLUCOSE 160* 12/24/2010 1342   BUN 26* 12/24/2010 1342   CREATININE 1.14 12/24/2010 1342   CALCIUM 8.5 12/24/2010 1342   GFRNONAA 69* 12/24/2010 1342   GFRAA 80* 12/24/2010  1342    CBC    Component Value Date/Time   WBC 15.7* 12/24/2010 1342   RBC 5.47 12/24/2010 1342   HGB 16.4 12/24/2010 1342   HCT 48.6 12/24/2010 1342   PLT 179 12/24/2010 1342   MCV 88.8 12/24/2010 1342   MCH 30.0 12/24/2010 1342   MCHC 33.7 12/24/2010 1342   RDW 13.5 12/24/2010 1342   LYMPHSABS 0.5* 12/24/2010 1342   MONOABS 0.6 12/24/2010 1342   EOSABS 0.0 12/24/2010 1342   BASOSABS 0.0 12/24/2010 1342      RADIOLOGY:  CXR: mild volume loss in RLL, perhaps early RLL infiltrate  IMPRESSION:   Febrile illness with resp distress (now markedly improved) and possible early RLL infiltrate Suspect viral illness with COPD exac and now possible early RLL PNA (post viral PNA - concern specifically for H flu and Staph aureus. Hx of MRSA wound infection  noted) -Agree with SDU level of care. If no need for NPPV overnight, may safely go to regular bed (or home depending on degree of improvement -Probably doesn't need Imipenem as resistant GNR infection unlikely -Agree with Vanc and Levofloxacin -Bronchodilators - has responded very nicely apparently -Doubt he needs systemic steroids and would like to avoid in setting of acute infectious process. Would use nebulized steroids while hospitalized -Recheck CXR in AM to r/o PNA -check urinary antigens for Legionella and pneumococcus -He may be discharged to home when he is able to keep SpO2 > 90 % on RA. I have transitioned him to 2lpm by [place] and he is holding his sats above 92% presently  Billy Fischer, MD;  PCCM service; Mobile [phone]

## 2010-12-24 NOTE — Progress Notes (Signed)
Patient ID: Daniel Norton, male   DOB: [birth year], 60 y.o.   MRN: [id number]    Subjective: Pt known to Dr. Carolynne Edouard s/p multiple abd surgeries.  He has had some wound problems on his abdomen that he is being treated with Bactroban ointment for.  He began having some SOB within the last couple of days and presented to the ED with worsening symptoms.  He is being admitted and we were asked to take a look at his abd wound.   Objective: Vital signs in last 24 hours: Temp:  [102.5 F (39.2 C)-103.2 F (39.6 C)] 102.5 F (39.2 C) (12/10 1420) Pulse Rate:  [109-120] 109  (12/10 1301) Resp:  [30] 30  (12/10 1301) BP: (103-113)/(67) 113/67 mmHg (12/10 1301) SpO2:  [84 %-96 %] 96 % (12/10 1301) Weight:  [217 lb (98.431 kg)] 217 lb (98.431 kg) (12/10 1500)    Intake/Output from previous day:   Intake/Output this shift:    PE: Abd: soft, NT, ND, +BS, chronic wound with old skin flaking off.  Some mild chronic appearing erythema.  No acute infection or drainage.  No evidence of abscess under skin.  Lab Results:   [facility] 12/24/10 1342  WBC 15.7*  HGB 16.4  HCT 48.6  PLT 179   BMET  Basename 12/24/10 1342  NA 135  K 3.9  CL 95*  CO2 30  GLUCOSE 160*  BUN 26*  CREATININE 1.14  CALCIUM 8.5   PT/INR No results found for this basename: LABPROT:2,INR:2 in the last 72 hours   Studies/Results: Dg Chest Portable 1 View  12/24/2010  *RADIOLOGY REPORT*  Clinical Data: Cough.  Hypoxia.  PORTABLE CHEST - 1 VIEW  Comparison: 06/05/2008.  Findings: Post CABG.  Heart size within normal limits.  Elevated right hemidiaphragm. Slightly limited evaluation of the right lung base.  Central pulmonary vascular prominence. No infiltrate, congestive heart failure or pneumothorax.  The patient would eventually benefit from follow-up two-view chest with cardiac leads removed.  IMPRESSION: No infiltrate or congestive heart failure.  Please see above.  Original Report Authenticated By: Fuller Canada, M.D.      Anti-infectives: Anti-infectives     Start     Dose/Rate Route Frequency Ordered Stop   12/25/10 1636   Levofloxacin (LEVAQUIN) IVPB 750 mg        750 mg 100 mL/hr over 90 Minutes Intravenous Every 24 hours 12/24/10 1528     12/25/10 0500   vancomycin (VANCOCIN) 1,250 mg in sodium chloride 0.9 % 250 mL IVPB        1,250 mg 166.7 mL/hr over 90 Minutes Intravenous Every 12 hours 12/24/10 1605     12/24/10 2330   imipenem-cilastatin (PRIMAXIN) 500 mg in sodium chloride 0.9 % 100 mL IVPB  Status:  Discontinued        500 mg 200 mL/hr over 30 Minutes Intravenous 3 times per day 12/24/10 1545 12/24/10 1628   12/24/10 1615   vancomycin (VANCOCIN) 1,250 mg in sodium chloride 0.9 % 250 mL IVPB        1,250 mg 166.7 mL/hr over 90 Minutes Intravenous  Once 12/24/10 1605     12/24/10 1600   imipenem-cilastatin (PRIMAXIN) 500 mg in sodium chloride 0.9 % 100 mL IVPB  Status:  Discontinued        500 mg 200 mL/hr over 30 Minutes Intravenous  Once 12/24/10 1545 12/24/10 1628   12/24/10 1545   Levofloxacin (LEVAQUIN) IVPB 750 mg  750 mg 100 mL/hr over 90 Minutes Intravenous To [facility] 12/24/10 1537 12/25/10 1545           Assessment/Plan  1. SOB, possible sepsis 2. Chronic abdominal wound  Plan: No evidence of acute infection.  Will order Bactroban, which was being used at home for his wound.  No further treatment for his abdominal wound is needed at this time.  Dr. Carolynne Edouard to see as well.  LOS: 0 days    Delle Andrzejewski E 12/24/2010

## 2010-12-24 NOTE — H&P (Addendum)
Daniel Norton CSN:[id number],MRN:[id number]  Outpatient Primary MD for the patient is [name], MD  With History of -  Past Medical History  Diagnosis Date  . Arthritis   . MRSA (methicillin resistant Staphylococcus aureus)   . Cellulitis of abdominal wall   . Walking pneumonia 06/29/2009  . Mass     recurrent chronically infected masses   . Diverticulitis   . Coronary artery disease   . COPD (chronic obstructive pulmonary disease)   . Smoking hx   . S/P CABG x 5   . Hypertension       Past Surgical History  Procedure Date  . Coronary artery bypass graft   . 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 Bowel resection may 2010  . Colectomy     sigmoid colectomy  . Colostomy   . Colostomy takedown   . Hernia repair     ventral hernia     in for   Chief Complaint  Patient presents with  . Shortness of Breath     HPI  Daniel Norton  is a 60 y.o. 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 Patient has been following with [facility] a close basis.  Patient comes in now with a three-day history of fever chills cough along with shortness of breath he did not take a flu shot this year, says that his symptoms got quite worse today so he had to come to the ER, in the ER he was found to have a MAXIMUM TEMPERATURE of 103.5, he was found to be breathing over 22 a minute, he had a white count of 15,000, and I was called to admit the patient for acute respiratory failure. Of note his chest x-ray till now does not show any acute infiltrate, patient denies any new abdominal pain his wife did have flu a few weeks ago, on my exam he's wearing nonrebreather  mask and feels better however he is hardly moving any air through his breathing activity which makes me suspect that he has rather advanced underlying COPD. 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    Review of Systems    In addition to the HPI above,  ++ Fever-chills, No Headache, No changes with Vision or hearing, No problems swallowing food or Liquids, No Chest pain, ++ Cough & Shortness of Breath, No Abdominal pain, No Nausea or Vommitting, Bowel movements are regular, No Blood in stool or Urine, No dysuria, No new skin rashes or bruises, No new joints pains-aches,  No new weakness, tingling, numbness in any extremity, No recent weight gain or loss, No polyuria, polydypsia or polyphagia, No significant Mental Stressors.  A full 10 point Review of Systems was done, except as stated above, all other Review of Systems were negative.   Social History History  Substance Use Topics  . Smoking status: Former Games developer  . Smokeless tobacco: Not on file  . Alcohol Use: Yes      Family History No CAD  Prior to Admission medications   Medication Sig Start Date End Date Taking? Authorizing Provider  amLODipine (NORVASC) 5 MG tablet Take 5  mg by mouth daily.  09/12/10  Yes Historical Provider, MD  CRESTOR 5 MG tablet Take 5 mg by mouth at bedtime.  09/12/10  Yes Historical Provider, MD  hydrochlorothiazide (HYDRODIURIL) 12.5 MG tablet Take 12.5 mg by mouth daily.    Yes Historical Provider, MD  metoprolol (TOPROL-XL) 100 MG 24 hr tablet Take 100 mg by mouth every morning.  09/02/10  Yes Historical Provider, MD  Multiple Vitamins-Minerals (MULTIVITAMINS THER. W/MINERALS) TABS Take 1 tablet by mouth daily.     Yes Historical Provider, MD  mupirocin cream (BACTROBAN) 2 % Apply 1 application topically 3 (three) times daily. Apply to affected area 3 times daily  10/11/10 10/11/11  Yes Caleen Essex III, MD  Omega-3 Fatty Acids (FISH OIL) 1200 MG CAPS Take 1 capsule by mouth daily.    Yes Historical Provider, MD    Allergies  Allergen Reactions  . Doxycycline     Skin rash (family not sure that this is a true reaction. Pt was in the sun when he had the reaction)  . Morphine And Related     Hallucinations   . Penicillins     Childhood reaction    Physical Exam No intake or output data in the 24 hours ending 12/24/10 1546 Blood pressure 113/67, pulse 109, temperature 102.5 F (39.2 C), temperature source Oral, resp. rate 30, height 5\' 10"  (1.778 m), weight 98.431 kg (217 lb), SpO2 96.00%.  1. General middle aged male lying in bed in SOB  2. Normal affect and insight, Not Suicidal or Homicidal, Awake Alert, Oriented *3.  3. No F.N deficits, ALL C.Nerves Intact, Strength 5/5 all 4 extremities, Sensation intact all 4 extremities, Plantars down going.  4. Ears and Eyes appear Normal, Conjunctivae clear, PERRLA. Moist Oral Mucosa.  5. Supple Neck, No JVD, No cervical lymphadenopathy appriciated, No Carotid Bruits.  6. Symmetrical Chest wall movement, Minimal air movement bilaterally, few wheezes  7. RRR, No Gallops, Rubs or Murmurs, No Parasternal Heave.  8. Positive Bowel Sounds, Abdomen Soft, Non tender, No organomegaly appriciated,  No rebound -guarding or rigidity. Post op Chr Abd wound red and swollen with some discharge.  9.  No Cyanosis, Normal Skin Turgor, No Skin Rash or Bruise.  10. Good muscle tone,  joints appear normal , no effusions, Normal ROM.  11. No Palpable Lymph Nodes in Neck or Axillae     Data Review  CBC  Lab 12/24/10 1342  WBC 15.7*  HGB 16.4  HCT 48.6  PLT 179  MCV 88.8  MCH 30.0  MCHC 33.7  RDW 13.5  LYMPHSABS 0.5*  MONOABS 0.6  EOSABS 0.0  BASOSABS 0.0  BANDABS --   ------------------------------------------------------------------------------------------------------------------ Chemistries   Lab 12/24/10 1342    NA 135  K 3.9  CL 95*  CO2 30  GLUCOSE 160*  BUN 26*  CREATININE 1.14  CALCIUM 8.5  MG --  AST 38*  ALT 27  ALKPHOS 52  BILITOT 0.5   ------------------------------------------------------------------------------------------------------------------ estimated creatinine clearance is 83.1 ml/min (by C-G formula based on Cr of 1.14). ------------------------------------------------------------------------------------------------------------------ Cardiac Enzymes  Lab 12/24/10 1344  CKMB 4.9*  TROPONINI <0.30  MYOGLOBIN --   ------------------------------------------------------------------------------------------------------------------ No components found with this basename: POCBNP:3 ------------------------------------------------------------------------------------------------------------------  Imaging results:   Dg Chest Portable 1 View  12/24/2010  *RADIOLOGY REPORT*  Clinical Data: Cough.  Hypoxia.  PORTABLE CHEST - 1 VIEW  Comparison: 06/05/2008.  Findings: Post CABG.  Heart size within normal limits.  Elevated right hemidiaphragm. Slightly limited evaluation of the  right lung base.  Central pulmonary vascular prominence. No infiltrate, congestive heart failure or pneumothorax.  The patient would eventually benefit from follow-up two-view chest with cardiac leads removed.  IMPRESSION: No infiltrate or congestive heart failure.  Please see above.  Original Report Authenticated By: Fuller Canada, M.D.   My personal review of EKG: Rhythm S.Tac, Rate  105 /min, QTc 465 , non specific ST changes     Assessment & Plan  #1. SIRs in a patient who has suspected MRSA infection in his abdominal wound - with acute on chronic COPD exasperation, in acute respiratory failure.- Plan is to admit the patient and stepped-down provide him with oxygen, when necessary BiPAP to keep his pulse ox over 90, and culture him, rule out flu, put him on broad-spectrum antibiotics including coverage  for MRSA i.e. vancomycin, imipenem ( as he has a penicillin allergy) along with Levaquin. We'll provide him with nebulizer treatments scheduled and when necessary. Have discussed the  case with pulmonary critical care physician Dr. Vassie Loll will follow the patient closely E Link. IV steroids, insulin sliding scale to cover for any associated hyperglycemia, IV fluids for hypotension.  #2. History of coronary artery disease status post CABG  -no chest pain no acute EKG changes, will place him on low-dose aspirin, continue home dose statin, as he is hypotensive I will put him on low dose Lopressor with strict holding parameters. Recheck BMP and follow cardiac enzymes. Will also check an echogram.  #3. 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 On my exam the site looks slightly more swollen and red wife says this has changed, I have already called [facility] for consult.  #4. Hypotension secondary to #1 above IV fluid bolus followed by maintenance IV fluid check lactate and Procalcitonin, monitor in stepped-down, pressors if he needed.  #5. History of dyslipidemia no acute issues we'll leave the patient on home dose statin than outpatient monitor.  #6. Elevated glucose could be incidental this is not fasting we'll check an A1c and monitor.  DVT Prophylaxis Heparin    AM Labs Ordered, also please review Full Orders Admission, patients condition and plan of care including tests being ordered have been discussed with the patient and wife who indicate understanding and agree with the plan and Code Status.  Code Status Full  Condition GUARDED  Total Critical Care time in examining the patient bedside, evaluating Lab work and other data, over half of the total time was spent in coordinating patient care on the floor or bedisde in talking to patient/family members, communicating with nursing Staff on the  floor and sub specialists Dr Vassie Loll, [facility],Dr Pendleton  to coordinate patients medical care and needs is  50 Minutes.   The condition which has caused critical injury/acute impairment of cardio-Pulm  vital organ system with a high probability of sudden clinically significant deterioration and can cause Potential Life threatening injury to this patient addressed today is  SIRs, Acute Resp failure,Hypotension.  Leroy Sea M.D 12/24/2010, 3:46 PM  [facility] Office  [phone]

## 2010-12-24 NOTE — ED Notes (Signed)
Was seen at  [facility] on Friday and sent home now has high fever and low sats coming from dr office

## 2010-12-24 NOTE — Progress Notes (Addendum)
ANTIBIOTIC CONSULT NOTE - INITIAL  Pharmacy Consult for Imipenem/Cilastatin Indication:  r/o PNA  Allergies  Allergen Reactions  . Doxycycline     Skin rash (family not sure that this is a true reaction. Pt was in the sun when he had the reaction)  . Morphine And Related     Hallucinations   . Penicillins     Childhood reaction    Patient Measurements: Height: 5\' 10"  (177.8 cm) Weight: 217 lb (98.431 kg) IBW/kg (Calculated) : 73    Vital Signs: Temp: 102.5 F (39.2 C) (12/10 1420) Temp src: Oral (12/10 1420) BP: 113/67 mmHg (12/10 1301) Pulse Rate: 109  (12/10 1301)   Labs:  Basename 12/24/10 1342  WBC 15.7*  HGB 16.4  PLT 179  LABCREA --  CREATININE 1.14   Estimated Creatinine Clearance: 83.1 ml/min (by C-G formula based on Cr of 1.14). No results found for this basename: VANCOTROUGH:2,VANCOPEAK:2,VANCORANDOM:2,GENTTROUGH:2,GENTPEAK:2,GENTRANDOM:2,TOBRATROUGH:2,TOBRAPEAK:2,TOBRARND:2,AMIKACINPEAK:2,AMIKACINTROU:2,AMIKACIN:2, in the last 72 hours   Microbiology: No results found for this or any previous visit (from the past 720 hour(s)).  Medical History: Past Medical History  Diagnosis Date  . Arthritis   . MRSA (methicillin resistant Staphylococcus aureus)   . Cellulitis of abdominal wall   . Walking pneumonia 06/29/2009  . Mass     recurrent chronically infected masses   . Diverticulitis   . Coronary artery disease   . COPD (chronic obstructive pulmonary disease)   . Smoking hx   . S/P CABG x 5   . Hypertension     Medications:  AMLODIPINE BESYLATE 5 MG PO TABS -Take 5 mg by mouth daily.   CRESTOR 5 MG PO TABS -Take 5 mg by mouth at bedtime.   HYDROCHLOROTHIAZIDE 12.5 MG PO TABS -Take 12.5 mg by mouth daily.   METOPROLOL SUCCINATE ER 100 MG PO TB24 -Take 100 mg by mouth every morning.   THERA M PLUS PO TABS -Take 1 tablet by mouth daily.   MUPIROCIN CALCIUM 2 % EX CREA Topical  Apply 1 application topically 3 (three) times daily. Apply to affected  area 3 times daily    Assessment: 60 yo sick for several days with upper respiratory infection and fever.  Fever now to 103 and he has worsening shortness of breath.  He has several co-morbidities and the plan is to add Imipenem/Cilastatin to provide broad spectrum antibiotic coverage.  Goal of Therapy:  Provide broad spectrum coverage to cover multiple species  Plan:  Imipenem/Cilastatin 500 mg IV q8 hours.   Nadara Mustard [place] 12/24/2010,3:46 PM  Will also add Vancomycin to coverage.  He is 70 inches and weight of 98 kg with an estimated clearance of 83 ml/min.  Vancomycin 1250 mg IV every 12 hours. Will check s/s levels and adjust as needed. Monitor renal function.

## 2010-12-24 NOTE — Progress Notes (Signed)
Nurse aware of critical lab:  CKMB=10.5 Troponin WNL.   Will monitor patient.

## 2010-12-24 NOTE — ED Provider Notes (Signed)
History     CSN: [id number] Arrival date & time: 12/24/2010 12:44 PM   First MD Initiated Contact with Patient 12/24/10 1330      Chief Complaint  Patient presents with  . Shortness of Breath    (Consider location/radiation/quality/duration/timing/severity/associated sxs/prior treatment) HPI.    Level V caveat for urgent need for intervention. Patient has been sick for several days with upper respiratory infection and fever. He was seen at [facility]. Chest x-ray and flu tests were negative. He has gotten worse. His respiratory rate and fever have worsened. Poor oral intake. No history of pneumonia.  Past Medical History  Diagnosis Date  . Arthritis   . MRSA (methicillin resistant Staphylococcus aureus)   . Cellulitis of abdominal wall   . Walking pneumonia 06/29/2009  . Mass     recurrent chronically infected masses   . Diverticulitis   . Coronary artery disease     Past Surgical History  Procedure Date  . Coronary artery bypass graft   . 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 Bowel resection may 2010  . Colectomy     sigmoid colectomy  . Colostomy   . Colostomy takedown   . Hernia repair     ventral hernia     No family history on file.  History  Substance Use Topics  . Smoking status: Former Games developer  . Smokeless tobacco: Not on file  . Alcohol Use: Yes      Review of Systems  Unable to perform ROS: Other    Allergies  Doxycycline; Morphine and related; and Penicillins  Home Medications   Current Outpatient Rx  Name Route Sig Dispense Refill  . AMLODIPINE BESYLATE 5 MG PO TABS Oral Take 5 mg by mouth daily.     . CRESTOR 5 MG PO TABS Oral Take 5 mg by mouth at bedtime.     Marland Kitchen HYDROCHLOROTHIAZIDE 12.5 MG PO TABS Oral Take 12.5 mg by mouth daily.     Marland Kitchen METOPROLOL SUCCINATE ER 100 MG PO TB24 Oral Take 100 mg by mouth every morning.     Carma Leaven M PLUS PO TABS Oral Take 1 tablet by mouth daily.      Marland Kitchen MUPIROCIN CALCIUM 2 % EX CREA Topical  Apply 1 application topically 3 (three) times daily. Apply to affected area 3 times daily     . FISH OIL 1200 MG PO CAPS Oral Take 1 capsule by mouth daily.       BP 113/67  Pulse 109  Temp(Src) 102.5 F (39.2 C) (Oral)  Resp 30  SpO2 96%  Physical Exam  Nursing note and vitals reviewed. Constitutional: He is oriented to person, place, and time.       Appears ill; patient is febrile, tachycardic, hypoxic  HENT:  Head: Normocephalic and atraumatic.  Eyes: Conjunctivae and EOM are normal. Pupils are equal, round, and reactive to light.  Neck: Normal range of motion. Neck supple.  Cardiovascular: Normal rate and regular rhythm.   Pulmonary/Chest:       Dyspneic, tachypneic, poor airway excursion  Abdominal: Soft. Bowel sounds are normal.  Musculoskeletal: Normal range of motion.  Neurological: He is alert and oriented to person, place, and time.  Skin: Skin is warm and dry.  Psychiatric: He has a normal mood and affect.    ED Course  Procedures (including critical care time)  Labs Reviewed  CBC - Abnormal; Notable for the following:    WBC 15.7 (*)    All other  components within normal limits  DIFFERENTIAL - Abnormal; Notable for the following:    Neutrophils Relative 93 (*)    Neutro Abs 14.6 (*)    Lymphocytes Relative 3 (*)    Lymphs Abs 0.5 (*)    All other components within normal limits  COMPREHENSIVE METABOLIC PANEL  CARDIAC PANEL(CRET KIN+CKTOT+MB+TROPI)  CULTURE, BLOOD (ROUTINE X 2)  CULTURE, BLOOD (ROUTINE X 2)   Dg Chest Portable 1 View  12/24/2010  *RADIOLOGY REPORT*  Clinical Data: Cough.  Hypoxia.  PORTABLE CHEST - 1 VIEW  Comparison: 06/05/2008.  Findings: Post CABG.  Heart size within normal limits.  Elevated right hemidiaphragm. Slightly limited evaluation of the right lung base.  Central pulmonary vascular prominence. No infiltrate, congestive heart failure or pneumothorax.  The patient would eventually benefit from follow-up two-view chest with cardiac  leads removed.  IMPRESSION: No infiltrate or congestive heart failure.  Please see above.  Original Report Authenticated By: Fuller Canada, M.D.     No diagnosis found.   Date: 12/24/2010  Rate: 105  Rhythm: sinus tachycardia  QRS Axis: normal  Intervals: normal  ST/T Wave abnormalities: normal  Conduction Disutrbances:left anterior fascicular block  Narrative Interpretation:   Old EKG Reviewed: none available   MDM  Suspect pneumonia or pneumonitis. Will give 100% oxygen, breathing treatments, chest x-ray, labs, admit.        Donnetta Hutching, MD 12/24/10 [phone]

## 2010-12-25 ENCOUNTER — Inpatient Hospital Stay (HOSPITAL_COMMUNITY): Payer: Medicare Other

## 2010-12-25 LAB — INFLUENZA PANEL BY PCR (TYPE A & B)
H1N1 flu by pcr: NOT DETECTED
Influenza B By PCR: NEGATIVE

## 2010-12-25 LAB — LEGIONELLA ANTIGEN, URINE: Legionella Antigen, Urine: NEGATIVE

## 2010-12-25 LAB — GLUCOSE, CAPILLARY
Glucose-Capillary: 131 mg/dL — ABNORMAL HIGH (ref 70–99)
Glucose-Capillary: 121 mg/dL — ABNORMAL HIGH (ref 70–99)

## 2010-12-25 LAB — BASIC METABOLIC PANEL
BUN: 23 mg/dL (ref 6–23)
Creatinine, Ser: 0.99 mg/dL (ref 0.50–1.35)
GFR calc Af Amer: >90 mL/min (ref >90)
GFR calc non Af Amer: 88 mL/min — ABNORMAL LOW (ref >90)
Glucose, Bld: 129 mg/dL — ABNORMAL HIGH (ref 70–99)

## 2010-12-25 LAB — CBC
HCT: 43.3 % (ref 39.0–52.0)
Hemoglobin: 14.4 g/dL (ref 13.0–17.0)
MCH: 29.9 pg (ref 26.0–34.0)
MCHC: 33.3 g/dL (ref 30.0–36.0)
MCV: 90 fL (ref 78.0–100.0)
RDW: 14 % (ref 11.5–15.5)

## 2010-12-25 LAB — CARDIAC PANEL(CRET KIN+CKTOT+MB+TROPI)
Relative Index: 1.1 (ref 0.0–2.5)
Total CK: 1338 U/L — ABNORMAL HIGH (ref 7–232)
Troponin I: <0.3 ng/mL (ref <0.30)

## 2010-12-25 MED ORDER — LEVOFLOXACIN IN D5W 750 MG/150ML IV SOLN
750.0000 mg | INTRAVENOUS | Status: DC
  Administered 2010-12-26 – 2010-12-27 (×2): 750 mg via INTRAVENOUS
  Filled 2010-12-25 (×2): qty 150

## 2010-12-25 NOTE — Progress Notes (Signed)
*  PRELIMINARY RESULTS* Echocardiogram 2D Echocardiogram has been performed.  Glean Salen [facility] 12/25/2010, 11:12 AM

## 2010-12-25 NOTE — Progress Notes (Addendum)
Daniel Norton CSN:[id number],MRN:[id number] is a 60 y.o. male,  Outpatient Primary MD for the patient is [name], MD  Chief Complaint  Patient presents with  . Shortness of Breath        Subjective:   Daniel Norton today has, No headache, No chest pain, No abdominal pain - No Nausea, No new weakness tingling or numbness, better Cough & SOB.    Objective:   Filed Vitals:   12/25/10 0500 12/25/10 0600 12/25/10 0700 12/25/10 0813  BP: 102/57   102/51  Pulse: 93 93  87  Temp: 99.2 F (37.3 C)   98 F (36.7 C)  TempSrc: Oral   Oral  Resp:    18  Height:      Weight:      SpO2: 97% 93% 93% 97%    Wt Readings from Last 3 Encounters:  12/24/10 98.3 kg (216 lb 11.4 oz)  10/22/10 96.072 kg (211 lb 12.8 oz)  10/11/10 95.255 kg (210 lb)     Intake/Output Summary (Last 24 hours) at 12/25/10 0842 Last data filed at 12/25/10 0600  Gross per 24 hour  Intake    400 ml  Output    352 ml  Net     48 ml    Exam Awake Alert, Oriented *3, No new F.N deficits, Normal affect [place].AT,PERRAL Supple Neck,No JVD, No cervical lymphadenopathy appriciated.  Symmetrical Chest wall movement, Mod air movement bilaterally, few rales RRR,No Gallops,Rubs or new Murmurs, No Parasternal Heave +ve B.Sounds, Abd Soft, Non tender, No organomegaly appriciated, No rebound -guarding or rigidity.Abd wound stable. No Cyanosis, Clubbing or edema, No new Rash or bruise     Data Review  CBC  Lab 12/25/10 0335 12/24/10 2019 12/24/10 1342  WBC 13.8* 13.8* 15.7*  HGB 14.4 15.8 16.4  HCT 43.3 45.6 48.6  PLT 164 178 179  MCV 90.0 89.4 88.8  MCH 29.9 31.0 30.0  MCHC 33.3 34.6 33.7  RDW 14.0 14.0 13.5  LYMPHSABS -- -- 0.5*  MONOABS -- -- 0.6  EOSABS -- -- 0.0  BASOSABS -- -- 0.0  BANDABS -- -- --    Chemistries   Lab 12/25/10 0335 12/24/10 2019 12/24/10 1342  NA 134* -- 135  K 3.6 -- 3.9  CL 96 -- 95*  CO2 29 -- 30  GLUCOSE 129* -- 160*  BUN 23 -- 26*  CREATININE 0.99 1.04 1.14    CALCIUM 7.6* -- 8.5  MG -- -- --  AST -- -- 38*  ALT -- -- 27  ALKPHOS -- -- 52  BILITOT -- -- 0.5   ------------------------------------------------------------------------------------------------------------------ estimated creatinine clearance is 95.6 ml/min (by C-G formula based on Cr of 0.99). ------------------------------------------------------------------------------------------------------------------  [facility] 12/24/10 1639  HGBA1C 6.3*   ------------------------------------------------------------------------------------------------------------------ No results found for this basename: CHOL:2,HDL:2,LDLCALC:2,TRIG:2,CHOLHDL:2,LDLDIRECT:2 in the last 72 hours ------------------------------------------------------------------------------------------------------------------ No results found for this basename: TSH,T4TOTAL,FREET3,T3FREE,THYROIDAB in the last 72 hours ------------------------------------------------------------------------------------------------------------------ No results found for this basename: VITAMINB12:2,FOLATE:2,FERRITIN:2,TIBC:2,IRON:2,RETICCTPCT:2 in the last 72 hours  Coagulation profile No results found for this basename: INR:5,PROTIME:5 in the last 168 hours  No results found for this basename: DDIMER:2 in the last 72 hours  Cardiac Enzymes  Lab 12/25/10 0335 12/24/10 1955 12/24/10 1344  CKMB 14.7* 10.5* 4.9*  TROPONINI <0.30 <0.30 <0.30  MYOGLOBIN -- -- --   ------------------------------------------------------------------------------------------------------------------ No components found with this basename: POCBNP:3  Micro Results Recent Results (from the past 240 hour(s))  MRSA PCR SCREENING     Status: Normal   Collection Time   12/24/10  7:12 PM      Component Value Range Status Comment   MRSA by PCR NEGATIVE  NEGATIVE  Final   CULTURE, SPUTUM-ASSESSMENT     Status: Normal   Collection Time   12/24/10 10:15 PM      Component  Value Range Status Comment   Specimen Description SPUTUM   Final    Special Requests NONE   Final    Sputum evaluation     Final    Value: THIS SPECIMEN IS ACCEPTABLE. RESPIRATORY CULTURE REPORT TO FOLLOW.   Report Status 12/24/2010 FINAL   Final   CULTURE, RESPIRATORY     Status: Normal (Preliminary result)   Collection Time   12/24/10 10:15 PM      Component Value Range Status Comment   Specimen Description SPUTUM   Final    Special Requests NONE   Final    Gram Stain     Final    Value: ABUNDANT WBC PRESENT, PREDOMINANTLY PMN     RARE SQUAMOUS EPITHELIAL CELLS PRESENT     ABUNDANT GRAM POSITIVE COCCI IN PAIRS     RARE GRAM POSITIVE RODS   Culture PENDING   Incomplete    Report Status PENDING   Incomplete     Radiology Reports Dg Chest Port 1 View  12/25/2010  *RADIOLOGY REPORT*  Clinical Data: Short of breath  PORTABLE CHEST - 1 VIEW  Comparison: Chest radiograph 12/24/2010  Findings: Sternotomy wires overlie stable cardiac silhouette.  Mild central venous pulmonary congestion is present.  No overt pulmonary edema.  There is mild air space disease in the right lower lobe which appears slightly increased.  No pneumothorax.  IMPRESSION: Concern for early right lower lobe pneumonia.  Mild central venous pulmonary congestion.  Original Report Authenticated By: Genevive Bi, M.D.   Dg Chest Portable 1 View  12/24/2010  *RADIOLOGY REPORT*  Clinical Data: Cough.  Hypoxia.  PORTABLE CHEST - 1 VIEW  Comparison: 06/05/2008.  Findings: Post CABG.  Heart size within normal limits.  Elevated right hemidiaphragm. Slightly limited evaluation of the right lung base.  Central pulmonary vascular prominence. No infiltrate, congestive heart failure or pneumothorax.  The patient would eventually benefit from follow-up two-view chest with cardiac leads removed.  IMPRESSION: No infiltrate or congestive heart failure.  Please see above.  Original Report Authenticated By: Fuller Canada, M.D.     Scheduled Meds:   . acetaminophen  650 mg Oral Once  . albuterol  2.5 mg Nebulization Q6H  . albuterol  5 mg Nebulization Once  . aspirin EC  81 mg Oral Daily  . budesonide  0.25 mg Nebulization Q6H  . heparin  5,000 Units Subcutaneous Q8H  . ipratropium  0.5 mg Nebulization Once  . ipratropium  0.5 mg Nebulization Q6H  . levofloxacin (LEVAQUIN) IV  750 mg Intravenous Q24H  . levofloxacin (LEVAQUIN) IV  750 mg Intravenous To Major  . levofloxacin (LEVAQUIN) IV  750 mg Intravenous Q24H  . metoprolol tartrate  25 mg Oral BID  . multivitamins ther. w/minerals  1 tablet Oral Daily  . mupirocin cream  1 application Topical TID  . mupirocin ointment   Topical TID  . rosuvastatin  5 mg Oral QHS  . sodium chloride  1,000 mL Intravenous Once  . vancomycin  1,250 mg Intravenous Q12H  . vancomycin  1,250 mg Intravenous Once  . DISCONTD: albuterol  2.5 mg Nebulization Q6H  . DISCONTD: imipenem-cilastatin  500 mg Intravenous Q8H  . DISCONTD: imipenem-cilastatin  500 mg  Intravenous Once  . DISCONTD: insulin aspart  0-9 Units Subcutaneous TID WC  . DISCONTD: methylPREDNISolone (SOLU-MEDROL) injection  60 mg Intravenous Q8H   Continuous Infusions:   . sodium chloride 150 mL/hr at 12/24/10 1716  . sodium chloride    . DISCONTD: sodium chloride 125 mL/hr at 12/24/10 1736   PRN Meds:.albuterol, albuterol  Assessment & Plan   #1. SIRs in a patient who has suspected MRSA infection in his abdominal wound , Now RLL Comm Acquired PNA over Infl A Viral Infection- No history of Alcohol or Aspiration- certainly much improved, on 1 lit [place] o2, in no distress, continue ABX per PCCM, will continue PRN Nebs and Pulm toilet. Move out of stepdown.  Influ A PCR +ve - droplet isolation, symptoms at least 3 days before admission, doubt Tamiflu of benefit will D/W PCCM.   #2. History of coronary artery disease status post CABG -no chest pain no acute EKG changes, continue him on low-dose aspirin, continue  home dose statin, continue low dose Lopressor with strict holding parameters. Negative Trop, pending echogram.   #3. 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 No acute issues per Surg.   #4. Hypotension secondary to #1 resolved post IVF, monitor.   #5. History of dyslipidemia no acute issues we'll leave the patient on home dose statin than outpatient monitor.   #6. 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 Counseled.   DVT Prophylaxis    Heparin    See all Orders from today for further details     Leroy Sea M.D 12/25/2010, 8:42 AM  [facility] Office  [phone]

## 2010-12-25 NOTE — Progress Notes (Signed)
Utilization Review Completed.  Thales Knipple T  12/25/2010 

## 2010-12-26 DIAGNOSIS — J154 Pneumonia due to other streptococci: Principal | ICD-10-CM | POA: Diagnosis present

## 2010-12-26 LAB — CBC
MCH: 29.7 pg (ref 26.0–34.0)
MCHC: 33.1 g/dL (ref 30.0–36.0)
Platelets: 176 10*3/uL (ref 150–400)
RBC: 4.71 MIL/uL (ref 4.22–5.81)

## 2010-12-26 LAB — BASIC METABOLIC PANEL
Calcium: 8.8 mg/dL (ref 8.4–10.5)
GFR calc non Af Amer: >90 mL/min (ref >90)
Sodium: 137 mEq/L (ref 135–145)

## 2010-12-26 MED ORDER — VANCOMYCIN HCL IN DEXTROSE 1-5 GM/200ML-% IV SOLN
1000.0000 mg | Freq: Three times a day (TID) | INTRAVENOUS | Status: DC
  Administered 2010-12-26 – 2010-12-27 (×3): 1000 mg via INTRAVENOUS
  Filled 2010-12-26 (×5): qty 200

## 2010-12-26 NOTE — Progress Notes (Signed)
Patient ID: Daniel Norton, male   DOB: [birth year], 60 y.o.   MRN: [id number] Subjective: No events overnight. Patient denies chest pain, shortness of breath, abdominal pain. Had bowel movement and reports ambulating.  Objective:  Vital signs in last 24 hours:  Filed Vitals:   12/25/10 2223 12/26/10 0336 12/26/10 0748 12/26/10 1500  BP: 113/68 113/71  138/61  Pulse: 84 77  80  Temp:  98.4 F (36.9 C)    TempSrc:      Resp:  18  18  Height:      Weight:  97.9 kg (215 lb 13.3 oz)    SpO2:  93% 95% 95%    Intake/Output from previous day:   Intake/Output Summary (Last 24 hours) at 12/26/10 1904 Last data filed at 12/26/10 1700  Gross per 24 hour  Intake   2257 ml  Output   2600 ml  Net   -343 ml    Physical Exam: General: Alert, awake, oriented x3, in no acute distress. HEENT: No bruits, no goiter. Moist mucous membranes, no scleral icterus, no conjunctival pallor. Heart: Regular rate and rhythm, without murmurs, rubs, gallops. Lungs: Clear to auscultation bilaterally. No wheezing, no rhonchi, no rales.  Abdomen: Soft, nontender, nondistended, positive bowel sounds. Extremities: No clubbing cyanosis or edema,  positive pedal pulses. Neuro: Grossly intact, nonfocal.    Lab Results:  Basic Metabolic Panel:    Component Value Date/Time   NA 137 12/26/2010 0538   K 3.5 12/26/2010 0538   CL 99 12/26/2010 0538   CO2 33* 12/26/2010 0538   BUN 17 12/26/2010 0538   CREATININE 0.78 12/26/2010 0538   GLUCOSE 113* 12/26/2010 0538   CALCIUM 8.8 12/26/2010 0538   CBC:    Component Value Date/Time   WBC 9.6 12/26/2010 0538   HGB 14.0 12/26/2010 0538   HCT 42.3 12/26/2010 0538   PLT 176 12/26/2010 0538   MCV 89.8 12/26/2010 0538   NEUTROABS 14.6* 12/24/2010 1342   LYMPHSABS 0.5* 12/24/2010 1342   MONOABS 0.6 12/24/2010 1342   EOSABS 0.0 12/24/2010 1342   BASOSABS 0.0 12/24/2010 1342      Lab 12/26/10 0538 12/25/10 0335 12/24/10 2019 12/24/10 1342  WBC 9.6 13.8*  13.8* 15.7*  HGB 14.0 14.4 15.8 16.4  HCT 42.3 43.3 45.6 48.6  PLT 176 164 178 179  MCV 89.8 90.0 89.4 88.8  MCH 29.7 29.9 31.0 30.0  MCHC 33.1 33.3 34.6 33.7  RDW 13.8 14.0 14.0 13.5  LYMPHSABS -- -- -- 0.5*  MONOABS -- -- -- 0.6  EOSABS -- -- -- 0.0  BASOSABS -- -- -- 0.0  BANDABS -- -- -- --    Lab 12/26/10 0538 12/25/10 0335 12/24/10 2019 12/24/10 1342  NA 137 134* -- 135  K 3.5 3.6 -- 3.9  CL 99 96 -- 95*  CO2 33* 29 -- 30  GLUCOSE 113* 129* -- 160*  BUN 17 23 -- 26*  CREATININE 0.78 0.99 1.04 1.14  CALCIUM 8.8 7.6* -- 8.5  MG -- -- -- --   No results found for this basename: INR:5,PROTIME:5 in the last 168 hours Cardiac markers:  Lab 12/25/10 1115 12/25/10 0335 12/24/10 1955  CKMB 13.3* 14.7* 10.5*  TROPONINI <0.30 <0.30 <0.30  MYOGLOBIN -- -- --   No components found with this basename: POCBNP:3 Recent Results (from the past 240 hour(s))  CULTURE, BLOOD (ROUTINE X 2)     Status: Normal (Preliminary result)   Collection Time   12/24/10  1:50 PM  Component Value Range Status Comment   Specimen Description BLOOD ARM RIGHT   Final    Special Requests BOTTLES DRAWN AEROBIC ONLY 7CC   Final    Setup Time [id number]   Final    Culture     Final    Value:        BLOOD CULTURE RECEIVED NO GROWTH TO DATE CULTURE WILL BE HELD FOR 5 DAYS BEFORE ISSUING A FINAL NEGATIVE REPORT   Report Status PENDING   Incomplete   CULTURE, BLOOD (ROUTINE X 2)     Status: Normal (Preliminary result)   Collection Time   12/24/10  2:30 PM      Component Value Range Status Comment   Specimen Description BLOOD ARM LEFT   Final    Special Requests BOTTLES DRAWN AEROBIC AND ANAEROBIC 6CC   Final    Setup Time [id number]   Final    Culture     Final    Value:        BLOOD CULTURE RECEIVED NO GROWTH TO DATE CULTURE WILL BE HELD FOR 5 DAYS BEFORE ISSUING A FINAL NEGATIVE REPORT   Report Status PENDING   Incomplete   MRSA PCR SCREENING     Status: Normal   Collection Time   12/24/10   7:12 PM      Component Value Range Status Comment   MRSA by PCR NEGATIVE  NEGATIVE  Final   CULTURE, SPUTUM-ASSESSMENT     Status: Normal   Collection Time   12/24/10 10:15 PM      Component Value Range Status Comment   Specimen Description SPUTUM   Final    Special Requests NONE   Final    Sputum evaluation     Final    Value: THIS SPECIMEN IS ACCEPTABLE. RESPIRATORY CULTURE REPORT TO FOLLOW.   Report Status 12/24/2010 FINAL   Final   CULTURE, RESPIRATORY     Status: Normal (Preliminary result)   Collection Time   12/24/10 10:15 PM      Component Value Range Status Comment   Specimen Description SPUTUM   Final    Special Requests NONE   Final    Gram Stain     Final    Value: ABUNDANT WBC PRESENT, PREDOMINANTLY PMN     RARE SQUAMOUS EPITHELIAL CELLS PRESENT     ABUNDANT GRAM POSITIVE COCCI IN PAIRS     RARE GRAM POSITIVE RODS   Culture MODERATE GROUP A STREP (S.PYOGENES) ISOLATED   Final    Report Status PENDING   Incomplete     Studies/Results: Dg Chest Port 1 View  12/25/2010  *RADIOLOGY REPORT*  Clinical Data: Short of breath  PORTABLE CHEST - 1 VIEW  Comparison: Chest radiograph 12/24/2010  Findings: Sternotomy wires overlie stable cardiac silhouette.  Mild central venous pulmonary congestion is present.  No overt pulmonary edema.  There is mild air space disease in the right lower lobe which appears slightly increased.  No pneumothorax.  IMPRESSION: Concern for early right lower lobe pneumonia.  Mild central venous pulmonary congestion.  Original Report Authenticated By: Genevive Bi, M.D.    Medications: Scheduled Meds:   . albuterol  2.5 mg Nebulization Q6H  . aspirin EC  81 mg Oral Daily  . budesonide  0.25 mg Nebulization Q6H  . heparin  5,000 Units Subcutaneous Q8H  . ipratropium  0.5 mg Nebulization Q6H  . levofloxacin (LEVAQUIN) IV  750 mg Intravenous Q24H  . metoprolol tartrate  25 mg Oral BID  .  multivitamins ther. w/minerals  1 tablet Oral Daily  .  mupirocin cream  1 application Topical TID  . mupirocin ointment   Topical TID  . rosuvastatin  5 mg Oral QHS  . vancomycin  1,000 mg Intravenous Q8H  . DISCONTD: vancomycin  1,250 mg Intravenous Q12H   Continuous Infusions:  PRN Meds:.albuterol, albuterol  Assessment/Plan:  Principal Problem:  *Pneumonia due to streptococcus, group A [ we will follow up the final results of respiratory cultures for sensitivities but for now continue vancomycin and Levaquin; blood cultures to date are negative  Active Problems:  Acute respiratory failure - respiratory status improving as patient is saturating above 92% on RA; respiratory failure was likely secondary to Strep Pyogenes lung infection, continue antibiotics and PRN nebulizer treatment   COPD (chronic obstructive pulmonary disease) - continue PRN nebulizers   Coronary artery disease - stable  Disposition - anticipate D/C home in 1 -2 days    LOS: 2 days   Iceis Knab 12/26/2010, 7:04 PM

## 2010-12-26 NOTE — Progress Notes (Signed)
ANTIBIOTIC CONSULT NOTE - FOLLOW UP  Pharmacy Consult for Vancomycin (Day 2 of planned 7) Indication: PNA  Assessment: 60yo male admitted with Dx of SIRs on IV vancomycin for suspected RLL Community acquired pneumonia with history of MRSA wound infection.  Also being treated with Levofloxacin (IV).  Renal function improved (CrCl ~114 ml/min) since admission; Wt 98kg; Tmax 98.6'F, WBC improved 9.6  Goal of Therapy:  Vancomycin trough level 15-20 mcg/ml  Plan:  Increase Vancomycin to 1000 mg IV Q 8 hours. Continue to monitor renal function and clinical improvement. Will check SS levels and adjust if needed.  Benjaman Pott, PharmD Pager [phone] 12/26/2010 10:02 AM    Allergies  Allergen Reactions  . Doxycycline     Skin rash (family not sure that this is a true reaction. Pt was in the sun when he had the reaction)  . Morphine And Related     Hallucinations   . Penicillins     Childhood reaction    Patient Measurements: Height: 5\' 8"  (172.7 cm) Weight: 215 lb 13.3 oz (97.9 kg) (scale a) IBW/kg (Calculated) : 68.4   Vital Signs: Temp: 98.4 F (36.9 C) (12/12 0336) BP: 113/71 mmHg (12/12 0336) Pulse Rate: 77  (12/12 0336) Intake/Output from previous day: 12/11 0701 - 12/12 0700 In: 1707 [P.O.:1057; IV Piggyback:650] Out: 2275 [Urine:2275] Intake/Output from this shift:    Labs:  [facility] 12/26/10 0538 12/25/10 0335 12/24/10 2019  WBC 9.6 13.8* 13.8*  HGB 14.0 14.4 15.8  PLT 176 164 178  LABCREA -- -- --  CREATININE 0.78 0.99 1.04   Estimated Creatinine Clearance: 114.2 ml/min (by C-G formula based on Cr of 0.78). No results found for this basename: VANCOTROUGH:2,VANCOPEAK:2,VANCORANDOM:2,GENTTROUGH:2,GENTPEAK:2,GENTRANDOM:2,TOBRATROUGH:2,TOBRAPEAK:2,TOBRARND:2,AMIKACINPEAK:2,AMIKACINTROU:2,AMIKACIN:2, in the last 72 hours   Microbiology: Recent Results (from the past 720 hour(s))  CULTURE, BLOOD (ROUTINE X 2)     Status: Normal (Preliminary result)   Collection Time   12/24/10  1:50 PM      Component Value Range Status Comment   Specimen Description BLOOD ARM RIGHT   Final    Special Requests BOTTLES DRAWN AEROBIC ONLY 7CC   Final    Setup Time [id number]   Final    Culture     Final    Value:        BLOOD CULTURE RECEIVED NO GROWTH TO DATE CULTURE WILL BE HELD FOR 5 DAYS BEFORE ISSUING A FINAL NEGATIVE REPORT   Report Status PENDING   Incomplete   CULTURE, BLOOD (ROUTINE X 2)     Status: Normal (Preliminary result)   Collection Time   12/24/10  2:30 PM      Component Value Range Status Comment   Specimen Description BLOOD ARM LEFT   Final    Special Requests BOTTLES DRAWN AEROBIC AND ANAEROBIC 6CC   Final    Setup Time [id number]   Final    Culture     Final    Value:        BLOOD CULTURE RECEIVED NO GROWTH TO DATE CULTURE WILL BE HELD FOR 5 DAYS BEFORE ISSUING A FINAL NEGATIVE REPORT   Report Status PENDING   Incomplete   MRSA PCR SCREENING     Status: Normal   Collection Time   12/24/10  7:12 PM      Component Value Range Status Comment   MRSA by PCR NEGATIVE  NEGATIVE  Final   CULTURE, SPUTUM-ASSESSMENT     Status: Normal   Collection Time   12/24/10 10:15 PM  Component Value Range Status Comment   Specimen Description SPUTUM   Final    Special Requests NONE   Final    Sputum evaluation     Final    Value: THIS SPECIMEN IS ACCEPTABLE. RESPIRATORY CULTURE REPORT TO FOLLOW.   Report Status 12/24/2010 FINAL   Final   CULTURE, RESPIRATORY     Status: Normal (Preliminary result)   Collection Time   12/24/10 10:15 PM      Component Value Range Status Comment   Specimen Description SPUTUM   Final    Special Requests NONE   Final    Gram Stain     Final    Value: ABUNDANT WBC PRESENT, PREDOMINANTLY PMN     RARE SQUAMOUS EPITHELIAL CELLS PRESENT     ABUNDANT GRAM POSITIVE COCCI IN PAIRS     RARE GRAM POSITIVE RODS   Culture MODERATE GROUP A STREP (S.PYOGENES) ISOLATED   Final    Report Status PENDING   Incomplete       Anti-infectives     Start     Dose/Rate Route Frequency Ordered Stop   12/26/10 0100   Levofloxacin (LEVAQUIN) IVPB 750 mg        750 mg 100 mL/hr over 90 Minutes Intravenous Every 24 hours 12/25/10 1021 01/01/11 0059   12/25/10 1636   Levofloxacin (LEVAQUIN) IVPB 750 mg  Status:  Discontinued        750 mg 100 mL/hr over 90 Minutes Intravenous Every 24 hours 12/24/10 1528 12/25/10 1019   12/25/10 0500   vancomycin (VANCOCIN) 1,250 mg in sodium chloride 0.9 % 250 mL IVPB        1,250 mg 166.7 mL/hr over 90 Minutes Intravenous Every 12 hours 12/24/10 1605     12/24/10 2330   imipenem-cilastatin (PRIMAXIN) 500 mg in sodium chloride 0.9 % 100 mL IVPB  Status:  Discontinued        500 mg 200 mL/hr over 30 Minutes Intravenous 3 times per day 12/24/10 1545 12/24/10 1628   12/24/10 2030   Levofloxacin (LEVAQUIN) IVPB 750 mg  Status:  Discontinued        750 mg 100 mL/hr over 90 Minutes Intravenous Every 24 hours 12/24/10 1930 12/25/10 1021   12/24/10 1615   vancomycin (VANCOCIN) 1,250 mg in sodium chloride 0.9 % 250 mL IVPB        1,250 mg 166.7 mL/hr over 90 Minutes Intravenous  Once 12/24/10 1605 12/25/10 0650   12/24/10 1600   imipenem-cilastatin (PRIMAXIN) 500 mg in sodium chloride 0.9 % 100 mL IVPB  Status:  Discontinued        500 mg 200 mL/hr over 30 Minutes Intravenous  Once 12/24/10 1545 12/24/10 1628   12/24/10 1545   Levofloxacin (LEVAQUIN) IVPB 750 mg        750 mg 100 mL/hr over 90 Minutes Intravenous To [facility] 12/24/10 1537 12/24/10 1800          Assessment: 60yo male admitted with Dx of SIRs on vancomycin for suspected RLL Community acquired pneumonia with history of MRSA wound infection.  Also being treated with Levofloxacin.  Renal function improved (CrCl ~114 ml/min) since admission. Wt 98kg; Patient afebrile, WBC improved 9.6   Goal of Therapy:  Vancomycin trough level 15-20 mcg/ml  Plan:  Increase Vancomycin IV to 1000 mg Q 8  hours. Continue to monitor renal function and clinical improvement Will check SS levels and adjust if needed.  Benjaman Pott, PharmD Pager [phone] 12/26/2010

## 2010-12-27 ENCOUNTER — Inpatient Hospital Stay (HOSPITAL_COMMUNITY): Payer: Medicare Other

## 2010-12-27 LAB — CULTURE, RESPIRATORY W GRAM STAIN

## 2010-12-27 LAB — BASIC METABOLIC PANEL
BUN: 15 mg/dL (ref 6–23)
GFR calc Af Amer: >90 mL/min (ref >90)
GFR calc non Af Amer: >90 mL/min (ref >90)
Potassium: 3.7 mEq/L (ref 3.5–5.1)
Sodium: 140 mEq/L (ref 135–145)

## 2010-12-27 LAB — GLUCOSE, CAPILLARY: Glucose-Capillary: 142 mg/dL — ABNORMAL HIGH (ref 70–99)

## 2010-12-27 LAB — CBC
MCHC: 32.9 g/dL (ref 30.0–36.0)
RDW: 13.6 % (ref 11.5–15.5)

## 2010-12-27 MED ORDER — LEVOFLOXACIN 750 MG PO TABS: 750.0000 mg | ORAL_TABLET | Freq: Every day | ORAL | Status: AC

## 2010-12-27 MED ORDER — SODIUM CHLORIDE 0.9 % IJ SOLN
3.0000 mL | Freq: Two times a day (BID) | INTRAMUSCULAR | Status: DC
  Administered 2010-12-27: 3 mL via INTRAVENOUS

## 2010-12-27 MED ORDER — LEVOFLOXACIN 750 MG PO TABS
750.0000 mg | ORAL_TABLET | Freq: Every day | ORAL | Status: DC
  Filled 2010-12-27: qty 1

## 2010-12-27 NOTE — Progress Notes (Signed)
IV d/c'ed. Tele d/c'ed. D/c instructions and medications discussed with pt and pt's spouse. All questions answered. Pt states understanding. Pt escorted out via a/c with staff. Pt d/c'ed to home.

## 2010-12-27 NOTE — Progress Notes (Signed)
   CARE MANAGEMENT NOTE 12/27/2010  Patient:  Daniel Norton, CAREW   Account Number:  [id number]  Date Initiated:  12/25/2010  Documentation initiated by:  Junius Creamer  Subjective/Objective Assessment:   adm w pneumonia     Action/Plan:   lives w wife, pcp dr Darcel Bayley [place]   Anticipated DC Date:  12/28/2010   Anticipated DC Plan:  HOME/SELF CARE      DC Planning Services  CM consult      Choice offered to / List presented to:             Status of service:   Medicare Important Message given?   (If response is "NO", the following Medicare IM given date fields will be blank) Date Medicare IM given:   Date Additional Medicare IM given:    Discharge Disposition:  HOME/SELF CARE  Per UR Regulation:  Reviewed for med. necessity/level of care/duration of stay  Comments:  12/11 debbie Kreg Earhart rn,bsn [phone]

## 2010-12-27 NOTE — Progress Notes (Signed)
Antibiotic consult:  On vanc/levaquin for PNA. Resp cx came back as group A strep. D/w Dr. Elisabeth Pigeon, will dc vanc and cont levaquin for a total of 7 days.  Plan:  1. Dc vanc 2. Levaquin 750mg  PO qday x4

## 2010-12-27 NOTE — Discharge Summary (Signed)
Patient ID: Daniel Norton MRN: [id number] DOB/AGE: 10-02-1950 60 y.o.  Admit date: 12/24/2010 Discharge date: 12/27/2010  Primary Care Physician:  [name], MD  Discharge Diagnoses:    Present on Admission:  .Hypotension .COPD (chronic obstructive pulmonary disease) .Coronary artery disease .SIRS (systemic inflammatory response syndrome) .Acute respiratory failure .Pneumonia due to streptococcus, group A  Principal Problem:  *Pneumonia due to streptococcus, group A Active Problems:  Acute respiratory failure  COPD (chronic obstructive pulmonary disease)  Coronary artery disease   Current Discharge Medication List    START taking these medications   Details  levofloxacin (LEVAQUIN) 750 MG tablet Take 1 tablet (750 mg total) by mouth daily at 6 PM. Qty: 4 tablet, Refills: 0      CONTINUE these medications which have NOT CHANGED   Details  amLODipine (NORVASC) 5 MG tablet Take 5 mg by mouth daily.     CRESTOR 5 MG tablet Take 5 mg by mouth at bedtime.     hydrochlorothiazide (HYDRODIURIL) 12.5 MG tablet Take 12.5 mg by mouth daily.     metoprolol (TOPROL-XL) 100 MG 24 hr tablet Take 100 mg by mouth every morning.     Multiple Vitamins-Minerals (MULTIVITAMINS THER. W/MINERALS) TABS Take 1 tablet by mouth daily.      mupirocin cream (BACTROBAN) 2 % Apply 1 application topically 3 (three) times daily. Apply to affected area 3 times daily     Omega-3 Fatty Acids (FISH OIL) 1200 MG CAPS Take 1 capsule by mouth daily.         Disposition and Follow-up: Patient will followup with her primary care physician in one week upon discharge  Consults:  Pulmonary/critical care  Significant Diagnostic Studies:  Dg Chest Port 1 View  12/25/2010 IMPRESSION: Concern for early right lower lobe pneumonia.  Mild central venous pulmonary congestion.    Dg Chest Portable 1 View  12/24/2010 IMPRESSION: No infiltrate or congestive heart failure.  Please see above.     Brief H and P: Patient is a 60 y.o. 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  Patient presents with complaints of  a three-day history of fever chills cough along with shortness of breath he did not take a flu shot this year, says that his symptoms got quite worse on the day of admission so he had to come to the ER. In the ER he was found to have a fever of 103.5, he was found to be tachypneic, he had a white count of 15,000. Patient had no complaints of chest pain, palpitations. No complaints of abdominal pain, blood in the stool or urine. Patient did report that his wife has chronic problem with flu and has recently tested positive for influenza A.  Physical Exam on Discharge:  Filed Vitals:   12/26/10 1500 12/26/10 2155 12/27/10 0512 12/27/10 0800  BP: 138/61 153/83 137/81   Pulse: 80 81 82   Temp:  98.7 F (37.1 C) 98.2 F (36.8 C)   TempSrc:      Resp: 18 18 22    Height:      Weight:   96.6 kg (212 lb 15.4 oz)   SpO2: 95% 95% 92% 92%     Intake/Output Summary (Last 24  hours) at 12/27/10 1505 Last data filed at 12/27/10 1324  Gross per 24 hour  Intake   2110 ml  Output   3170 ml  Net  -1060 ml    General: Alert, awake, oriented x3, in no acute distress. HEENT: No bruits, no goiter. Heart: Regular rate and rhythm, without murmurs, rubs, gallops. Lungs: Clear to auscultation bilaterally. Abdomen: Soft, nontender, nondistended, positive bowel sounds. Extremities: No clubbing cyanosis or edema with positive pedal pulses. Neuro: Grossly intact, nonfocal.  CBC:    Component Value Date/Time   WBC 6.9 12/27/2010 0650   HGB 14.7 12/27/2010 0650    HCT 44.7 12/27/2010 0650   PLT 201 12/27/2010 0650   MCV 89.6 12/27/2010 0650   NEUTROABS 14.6* 12/24/2010 1342   LYMPHSABS 0.5* 12/24/2010 1342   MONOABS 0.6 12/24/2010 1342   EOSABS 0.0 12/24/2010 1342   BASOSABS 0.0 12/24/2010 1342    Basic Metabolic Panel:    Component Value Date/Time   NA 140 12/27/2010 0650   K 3.7 12/27/2010 0650   CL 100 12/27/2010 0650   CO2 33* 12/27/2010 0650   BUN 15 12/27/2010 0650   CREATININE 0.78 12/27/2010 0650   GLUCOSE 91 12/27/2010 0650   CALCIUM 9.0 12/27/2010 0650    Hospital Course:  Principal Problem:  *Pneumonia due to streptococcus, group A - patient has tested positive for influenza A. at 12/24/2010 and respiratory cultures grew group A strep; patient will be sent home on Levaquin for 4 more days to be total of 7 days of antibiotics.  Active Problems:  Acute respiratory failure - likely secondary to combination Haemophilus influenza virus as well as streptococcal pneumonia; patient was treated with nebulizer treatments as well as antibiotics, Levaquin and vancomycin. At the time of the discharge the patient is saturating 95% on room air   COPD (chronic obstructive pulmonary disease) - patient may continue same medications that he was taking prior to the admission.  Disposition - patient is medically stable and clinically appears well to be discharged home  Education - patient and family are aware of plan of care and treatment  Time spent on Discharge: Greater than 30 minutes  Signed: Oswald Pott 12/27/2010, 3:05 PM

## 2010-12-30 LAB — CULTURE, BLOOD (ROUTINE X 2)
Culture: NO GROWTH
Culture: NO GROWTH

## 2011-01-16 ENCOUNTER — Encounter (INDEPENDENT_AMBULATORY_CARE_PROVIDER_SITE_OTHER): Payer: Medicare Other | Admitting: General Surgery

## 2011-01-22 ENCOUNTER — Encounter (INDEPENDENT_AMBULATORY_CARE_PROVIDER_SITE_OTHER): Payer: Self-pay | Admitting: General Surgery

## 2011-01-22 ENCOUNTER — Ambulatory Visit (INDEPENDENT_AMBULATORY_CARE_PROVIDER_SITE_OTHER): Payer: Medicare Other | Admitting: General Surgery

## 2011-01-22 VITALS — BP 134/72 | HR 80 | Temp 98.0°F | Ht 70.0 in | Wt 218.6 lb

## 2011-01-22 DIAGNOSIS — IMO0002 Reserved for concepts with insufficient information to code with codable children: Secondary | ICD-10-CM

## 2011-01-23 ENCOUNTER — Encounter (INDEPENDENT_AMBULATORY_CARE_PROVIDER_SITE_OTHER): Payer: Self-pay | Admitting: General Surgery

## 2011-01-23 NOTE — Progress Notes (Signed)
Subjective:     Patient ID: Daniel Norton, male   DOB: [birth year], 61 y.o.   MRN: [id number]  HPI The patient is a 61 year old white male who is a long-standing patient of mine who has a chronic abdominal wound with a small area of exposed mesh. He was recently hospitalized with fevers and chills. At that time he was not having any abdominal pain. I believe his symptoms were caused by the flu. He is now at home and feeling much better. He has no abdominal complaints.  Review of Systems     Objective:   Physical Exam On exam his abdomen is soft and nontender. His scar along his midline is unchanged. The exposed mesh is unchanged. There is no sign of infection.    Assessment:     Chronic abdominal wound    Plan:     His wound is stable and unchanged. He will continue back to his baseline activities. We will plan to see him back on a p.r.n. basis.

## 2011-08-02 ENCOUNTER — Ambulatory Visit (INDEPENDENT_AMBULATORY_CARE_PROVIDER_SITE_OTHER): Payer: Medicare Other | Admitting: General Surgery

## 2011-08-02 ENCOUNTER — Encounter (INDEPENDENT_AMBULATORY_CARE_PROVIDER_SITE_OTHER): Payer: Self-pay | Admitting: General Surgery

## 2011-08-02 VITALS — BP 126/88 | HR 78 | Temp 97.5°F | Resp 16 | Ht 70.0 in | Wt 217.8 lb

## 2011-08-02 DIAGNOSIS — IMO0002 Reserved for concepts with insufficient information to code with codable children: Secondary | ICD-10-CM

## 2011-08-02 MED ORDER — SULFAMETHOXAZOLE-TRIMETHOPRIM 400-80 MG PO TABS: 1.0000 | ORAL_TABLET | Freq: Every day | ORAL | Status: AC

## 2011-08-06 ENCOUNTER — Encounter (INDEPENDENT_AMBULATORY_CARE_PROVIDER_SITE_OTHER): Payer: Self-pay | Admitting: General Surgery

## 2011-08-06 NOTE — Progress Notes (Signed)
Subjective:     Patient ID: Daniel Norton, male   DOB: Jan 11, 1951, 61 y.o.   MRN: [id number]  HPI The patient is a 61 year old white male who has been a patient of mine for many years. He has a chronic abdominal wound with some exposed mesh. We have been managing this nonoperatively for a long time. He comes in today with some new soreness and redness at his previous ostomy site on the left abdomen. This area does flare up from time to time.  Review of Systems  Constitutional: Positive for fever.  HENT: Negative.   Eyes: Negative.   Respiratory: Negative.   Cardiovascular: Negative.   Gastrointestinal: Positive for abdominal pain.  Genitourinary: Negative.   Musculoskeletal: Negative.   Skin: Negative.   Neurological: Negative.   Hematological: Negative.   Psychiatric/Behavioral: Negative.        Objective:   Physical Exam  Constitutional: He is oriented to person, place, and time. He appears well-developed and well-nourished.  HENT:  Head: Normocephalic and atraumatic.  Eyes: Conjunctivae and EOM are normal. Pupils are equal, round, and reactive to light.  Neck: Normal range of motion. Neck supple.  Cardiovascular: Normal rate, regular rhythm and normal heart sounds.   Pulmonary/Chest: Effort normal and breath sounds normal.  Abdominal: Soft.       There is a tender red area on the left abdomen. This area was probed with a Q-tip. In doing so I believe I saw a blue Novafil stitch. I then probed the area with a hemostat and was able to grab the stitch. We divided and removed the stitch. I then covered the area with antibiotic ointment and a sterile dressing. He tolerated this well.  Musculoskeletal: Normal range of motion.  Neurological: He is alert and oriented to person, place, and time.  Skin: Skin is warm and dry.  Psychiatric: He has a normal mood and affect. His behavior is normal.       Assessment:     The patient has a chronic abdominal wound. Today we were able to  remove a Novafil stitch from his left abdominal area.    Plan:     He will continue to keep the area clean and dry. He will use an antibiotic ointment for the next couple days. He states that he has to meet with a medical physician who will determine his future disability. I am concerned about this person making a judgment about the stability of his abdominal wall since I am the one that is done all his surgery. I told the patient I will be happy to discuss his condition with this new medical physician. I will plan to see him back in a few weeks to check the area.

## 2011-08-19 ENCOUNTER — Encounter (INDEPENDENT_AMBULATORY_CARE_PROVIDER_SITE_OTHER): Payer: Self-pay | Admitting: General Surgery

## 2011-08-19 ENCOUNTER — Ambulatory Visit (INDEPENDENT_AMBULATORY_CARE_PROVIDER_SITE_OTHER): Payer: Medicare Other | Admitting: General Surgery

## 2011-08-19 VITALS — BP 160/80 | HR 72 | Temp 97.8°F | Resp 16 | Ht 70.0 in | Wt 222.2 lb

## 2011-08-19 DIAGNOSIS — IMO0002 Reserved for concepts with insufficient information to code with codable children: Secondary | ICD-10-CM

## 2011-08-19 NOTE — Progress Notes (Signed)
Subjective:     Patient ID: Daniel Norton, male   DOB: 09-Sep-1950, 61 y.o.   MRN: [id number]  HPI The patient is a 61 year old white male who has been a patient of mine for a number of years. He has a chronic abdominal wound with a small area of exposed mesh. At his last visit he and some redness and tenderness at his old ostomy site. We were able to remove a Novofil stitch from this location. He tolerated this well. He is actually feeling better today. He is having less abdominal discomfort. The redness is slowly resolving.  Review of Systems     Objective:   Physical Exam On exam his abdomen is soft with minimal tenderness. The old ostomy site looks better with less redness. The area of exposed mesh is stable with no sign of active infection at this point    Assessment:     Chronic abdominal wound    Plan:     At this point he looks better than he did about 2 or 3 weeks ago. I have encouraged him to use mupirocin ointment on the old ostomy site. We will plan to see him back in about 2 months to check his progress

## 2011-10-15 ENCOUNTER — Encounter (INDEPENDENT_AMBULATORY_CARE_PROVIDER_SITE_OTHER): Payer: Self-pay | Admitting: General Surgery

## 2011-10-15 ENCOUNTER — Ambulatory Visit (INDEPENDENT_AMBULATORY_CARE_PROVIDER_SITE_OTHER): Payer: Medicare Other | Admitting: General Surgery

## 2011-10-15 VITALS — BP 156/81 | HR 74 | Temp 98.5°F | Ht 70.0 in | Wt 225.6 lb

## 2011-10-15 DIAGNOSIS — IMO0002 Reserved for concepts with insufficient information to code with codable children: Secondary | ICD-10-CM

## 2011-10-15 NOTE — Progress Notes (Signed)
Subjective:     Patient ID: Daniel Norton, male   DOB: [birth year], 61 y.o.   MRN: [id number]  HPI The patient is a 61 year old white male who has been placed him on for a long time. He has a history of a partial colectomy and colostomy with subsequent colostomy reversal. He has had a ventral hernia repair with mesh with some wound problems and now has a small area of some exposed mesh on his abdominal wall. Since his last visit he is doing reasonably well. He does note that he has been having some crampy abdominal pain on the right. This is occurring nearly every day but seems to be relieved with food intake or rest. He denies any fevers or nausea and vomiting.  Review of Systems  Constitutional: Positive for fatigue.  HENT: Negative.   Eyes: Negative.   Respiratory: Positive for shortness of breath.   Cardiovascular: Negative.   Gastrointestinal: Positive for abdominal pain.  Genitourinary: Negative.   Musculoskeletal: Negative.   Skin: Negative.   Neurological: Negative.   Hematological: Negative.   Psychiatric/Behavioral: Negative.        Objective:   Physical Exam  Constitutional: He is oriented to person, place, and time. He appears well-developed and well-nourished.  HENT:  Head: Normocephalic and atraumatic.  Eyes: Conjunctivae normal and EOM are normal. Pupils are equal, round, and reactive to light.  Neck: Normal range of motion. Neck supple.  Cardiovascular: Normal rate, regular rhythm and normal heart sounds.   Pulmonary/Chest: Effort normal and breath sounds normal.  Abdominal: Soft. Bowel sounds are normal.       Mild tenderness of the right abdominal wall. The chronic wound and exposed mesh is stable with no sign of active infection today  Musculoskeletal: Normal range of motion.  Neurological: He is alert and oriented to person, place, and time.  Skin: Skin is warm and dry.  Psychiatric: He has a normal mood and affect. His behavior is normal.       Assessment:      The patient has a chronic abdominal wound with a small area of exposed mesh which is stable    Plan:     At this point his activity is limited by his COPD and chronic abdominal wound. We will plan to see him back in about 3 months to check his progress

## 2012-01-20 ENCOUNTER — Encounter (INDEPENDENT_AMBULATORY_CARE_PROVIDER_SITE_OTHER): Payer: Medicare Other | Admitting: General Surgery

## 2012-01-21 ENCOUNTER — Encounter (INDEPENDENT_AMBULATORY_CARE_PROVIDER_SITE_OTHER): Payer: Self-pay | Admitting: General Surgery

## 2012-01-21 ENCOUNTER — Ambulatory Visit (INDEPENDENT_AMBULATORY_CARE_PROVIDER_SITE_OTHER): Payer: Medicare Other | Admitting: General Surgery

## 2012-01-21 VITALS — BP 160/72 | HR 76 | Temp 97.8°F | Resp 16 | Ht 70.0 in | Wt 228.0 lb

## 2012-01-21 DIAGNOSIS — T8189XA Other complications of procedures, not elsewhere classified, initial encounter: Secondary | ICD-10-CM

## 2012-01-21 DIAGNOSIS — L089 Local infection of the skin and subcutaneous tissue, unspecified: Secondary | ICD-10-CM

## 2012-01-21 NOTE — Progress Notes (Signed)
Subjective:     Patient ID: Daniel Norton, male   DOB: 04/14/1950, 62 y.o.   MRN: [id number]  HPI The patient is a 62 year old white male who is a long-term patient of mine. He initially had a perforated diverticulitis. He had a partial colectomy with colostomy. He had a subsequent colostomy takedown. He then had a ventral hernia repair with mesh. He has had a chronic wound with a small area of exposed mesh for a number of years. His abdominal wound has been pretty stable over the last year or 2. He denies any fevers or chills.  Review of Systems  Constitutional: Negative.   HENT: Negative.   Eyes: Negative.   Respiratory: Positive for shortness of breath.   Cardiovascular: Negative.   Gastrointestinal: Negative.   Genitourinary: Negative.   Musculoskeletal: Negative.   Skin: Positive for wound.  Neurological: Negative.   Hematological: Negative.   Psychiatric/Behavioral: Negative.        Objective:   Physical Exam  Constitutional: He is oriented to person, place, and time. He appears well-developed and well-nourished.  HENT:  Head: Normocephalic and atraumatic.  Eyes: Conjunctivae normal and EOM are normal. Pupils are equal, round, and reactive to light.  Neck: Normal range of motion. Neck supple.  Cardiovascular: Normal rate, regular rhythm and normal heart sounds.   Pulmonary/Chest: Effort normal and breath sounds normal.  Abdominal: Soft. Bowel sounds are normal.       There is a small area of exposed mesh on his central abdominal wall. There is no sign of active infection today. There is a palpable stitch beneath the skin at the old ostomy site.  Musculoskeletal: Normal range of motion.  Neurological: He is alert and oriented to person, place, and time.  Skin: Skin is warm and dry.  Psychiatric: He has a normal mood and affect. His behavior is normal.       Assessment:     The patient has a chronic abdominal wound that has been stable with no active infection  recently    Plan:     At this point he will continue to care for his abdominal wall. We will plan to see him back in another 3-6 months.

## 2012-10-15 ENCOUNTER — Telehealth (INDEPENDENT_AMBULATORY_CARE_PROVIDER_SITE_OTHER): Payer: Self-pay | Admitting: *Deleted

## 2012-10-15 NOTE — Telephone Encounter (Signed)
Patient called in to report that one of his abdominal wounds became really red the other day and then last night came open.  Patient states it first drained a clear fluid which then changed over to bleeding.  Patient denies any fevers or pus drainage at this time.  Offered patient urgent office appt this afternoon however he states he would rather hold off and see Dr. Carolynne Edouard.  Explained that Dr. Billey Chang first available appt is not until the 17th.  Patient agreeable with this appt at this time but understands that if there is pus drainage or any further signs of infection then he needs to call back and come into urgent office.  Patient states understanding of this plan and agreeable at this time.

## 2012-10-16 ENCOUNTER — Other Ambulatory Visit (INDEPENDENT_AMBULATORY_CARE_PROVIDER_SITE_OTHER): Payer: Self-pay | Admitting: General Surgery

## 2012-10-16 DIAGNOSIS — L089 Local infection of the skin and subcutaneous tissue, unspecified: Secondary | ICD-10-CM

## 2012-10-16 MED ORDER — SULFAMETHOXAZOLE-TRIMETHOPRIM 800-160 MG PO TABS: 2.0000 | ORAL_TABLET | Freq: Two times a day (BID) | ORAL | Status: DC

## 2012-10-16 NOTE — Telephone Encounter (Signed)
i called him in some bactrim. Talk to michelle. i am sure i can see him sooner

## 2012-10-19 NOTE — Telephone Encounter (Signed)
LMOM> bactrim at pharmacy. Can be seen tomorrow if pt can come in at 11:00.

## 2012-10-20 ENCOUNTER — Encounter (INDEPENDENT_AMBULATORY_CARE_PROVIDER_SITE_OTHER): Payer: Self-pay | Admitting: General Surgery

## 2012-10-20 ENCOUNTER — Ambulatory Visit (INDEPENDENT_AMBULATORY_CARE_PROVIDER_SITE_OTHER): Payer: Medicare Other | Admitting: General Surgery

## 2012-10-20 VITALS — BP 140/80 | HR 76 | Temp 97.5°F | Resp 16 | Ht 70.0 in | Wt 225.0 lb

## 2012-10-20 DIAGNOSIS — S31109A Unspecified open wound of abdominal wall, unspecified quadrant without penetration into peritoneal cavity, initial encounter: Secondary | ICD-10-CM

## 2012-10-20 DIAGNOSIS — L089 Local infection of the skin and subcutaneous tissue, unspecified: Secondary | ICD-10-CM

## 2012-10-20 NOTE — Patient Instructions (Signed)
Call if redness of abdomen occurs or if you need spot on chest removed

## 2012-10-21 NOTE — Progress Notes (Signed)
Subjective:     Patient ID: Daniel Norton, male   DOB: [birth year], 62 y.o.   MRN: [id number]  HPI The patient is a 62 year old white male who is a long-term patient of mine. 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 He also had a ventral hernia repair with mesh and he now has a chronic abdominal wound with a small area of exposed mesh. Periodically the area around this we'll get red. This happened to him a couple of weeks ago. Since that time the redness has resolved. The rest of his abdomen was then stable and unchanged. His appetite is good and his bowels are working normally. He still has a lot of problems with his breathing and has been on Levaquin recently for a possible Upper respiratory infection. He denies any fevers or chills. He denies any abdominal pain.  Review of Systems  Constitutional: Positive for fatigue.  HENT: Positive for congestion.   Eyes: Negative.   Respiratory: Positive for cough and shortness of breath.   Cardiovascular: Negative.   Gastrointestinal: Negative.   Endocrine: Negative.   Genitourinary: Negative.   Musculoskeletal: Negative.   Skin: Negative.   Allergic/Immunologic: Negative.   Neurological: Negative.   Hematological: Negative.   Psychiatric/Behavioral: Negative.        Objective:   Physical Exam  Constitutional: He is oriented to person, place, and time. He appears well-developed and well-nourished.  HENT:  Head: Normocephalic and atraumatic.  Eyes: Conjunctivae and EOM are normal. Pupils are equal, round, and reactive to light.  Neck: Normal range of motion. Neck supple.  Cardiovascular: Normal rate, regular rhythm and normal heart sounds.   Pulmonary/Chest: Effort normal and breath sounds normal.  Abdominal: Soft. Bowel sounds are normal.  The small area of exposed mesh is unchanged. There is no cellulitis today. His scar looks the same as it has. His abdomen is soft and nontender.   Musculoskeletal: Normal range of motion.  Neurological: He is alert and oriented to person, place, and time.  Skin: Skin is warm and dry.  Psychiatric: He has a normal mood and affect. His behavior is normal.       Assessment:     The patient has a chronic abdominal wound with a small area of exposed mesh which has been stable.     Plan:     At this point he has some Bactrim to take if the redness returns. Otherwise there is no intervention required at this point. I will plan to see him back in about 6 months for a recheck him last he has any problems between now and then.

## 2012-10-27 ENCOUNTER — Telehealth (INDEPENDENT_AMBULATORY_CARE_PROVIDER_SITE_OTHER): Payer: Self-pay

## 2012-10-27 NOTE — Telephone Encounter (Signed)
LMOM> moved appt from Friday up to Thursday 10/16 at 10:20am. Told pt to call and reschedule if he couldn't make that date and time. All new appt info was left on VM.

## 2012-10-29 ENCOUNTER — Encounter (INDEPENDENT_AMBULATORY_CARE_PROVIDER_SITE_OTHER): Payer: Medicare Other | Admitting: General Surgery

## 2012-10-30 ENCOUNTER — Encounter (INDEPENDENT_AMBULATORY_CARE_PROVIDER_SITE_OTHER): Payer: Medicare Other | Admitting: General Surgery

## 2013-04-21 ENCOUNTER — Ambulatory Visit (INDEPENDENT_AMBULATORY_CARE_PROVIDER_SITE_OTHER)
Admission: RE | Admit: 2013-04-21 | Discharge: 2013-04-21 | Disposition: A | Discharge status: Home / Self Care | Payer: Medicare Other | Source: Ambulatory Visit | Attending: Pulmonary Disease | Admitting: Pulmonary Disease

## 2013-04-21 ENCOUNTER — Ambulatory Visit (INDEPENDENT_AMBULATORY_CARE_PROVIDER_SITE_OTHER): Payer: Medicare Other | Admitting: Pulmonary Disease

## 2013-04-21 ENCOUNTER — Institutional Professional Consult (permissible substitution): Payer: Medicare Other | Admitting: Pulmonary Disease

## 2013-04-21 ENCOUNTER — Telehealth: Payer: Self-pay | Admitting: Pulmonary Disease

## 2013-04-21 ENCOUNTER — Encounter: Payer: Self-pay | Admitting: Pulmonary Disease

## 2013-04-21 VITALS — BP 140/74 | HR 72 | Ht 70.0 in | Wt 235.0 lb

## 2013-04-21 DIAGNOSIS — J449 Chronic obstructive pulmonary disease, unspecified: Secondary | ICD-10-CM

## 2013-04-21 DIAGNOSIS — F17200 Nicotine dependence, unspecified, uncomplicated: Secondary | ICD-10-CM

## 2013-04-21 DIAGNOSIS — R0602 Shortness of breath: Secondary | ICD-10-CM

## 2013-04-21 DIAGNOSIS — Z87891 Personal history of nicotine dependence: Secondary | ICD-10-CM

## 2013-04-21 MED ORDER — ALBUTEROL SULFATE HFA 108 (90 BASE) MCG/ACT IN AERS: 2.0000 | INHALATION_SPRAY | Freq: Four times a day (QID) | RESPIRATORY_TRACT | Status: AC | PRN

## 2013-04-21 MED ORDER — AEROCHAMBER MV MISC: Status: AC

## 2013-04-21 NOTE — Assessment & Plan Note (Signed)
He qualifies for lung cancer screening. Will order LDCT for lung cancer screening

## 2013-04-21 NOTE — Assessment & Plan Note (Addendum)
Last year's PFTs showed severe COPD.  This is the most likely reason for his dyspnea, though obesity also contributes.  He is currently not taking Symbicort appropriately.  Before adding or changing any medicines I want to work with him to try to maximize benefit from the Symbicort.  His ambulatory O2 saturation dropped briefly on this visit.  Hopefully this will improve with weight loss and proper symbicort use.  If no improvement he will need supplemental oxygen.  Plan: -CXR -use symbicort two puffs bid with a spacer -Use albuterol prn -check with PCP> what type of pneumonia shot was given -exercise regularly and lose weight -repeat ambulatory O2 saturation on next visit.

## 2013-04-21 NOTE — Patient Instructions (Signed)
Use the Symbicort 2 puffs with a spacer twice a day Use the albuterol inhaler 2 puffs every four hours as needed for shortness of breath Exercise regularly and lose weight We will call you with results of your Chest X-ray We will arrange a CT scan of your chest We will see you back in 4-6 weeks or sooner if needed

## 2013-04-21 NOTE — Progress Notes (Signed)
Subjective:    Patient ID: DELDRICK Norton, male    DOB: 1950/12/20, 63 y.o.   MRN: [id number]  HPI  This is a very pleasant [age]-year-old male who comes to my clinic today to establish care for shortness of breath. He previously smoked 2 packs of cigarettes daily for 30 years and quit at age 20. He says that he has had shortness of breath off and on for many years and he has always attributed to aging and weight gain. He noticed that he has gained 10 pounds in the last 6 months. Over the wintertime he had an upper respiratory infection which made him cough more and have more chest congestion. This is associated with increasing shortness of breath. Since then the cough has resolved and he no longer has the chest congestion but he still feels more short of breath. He notes he gets really short of breath when he bends over to tie his shoes. He gets short of breath with climbing a flight of stairs or carrying heavy objects.  He has not had chest pain but he has had chronic leg swelling in his left leg ever since coronary artery bypass grafting several years ago. His other leg has not been swelling very much. He has not had pain and leg.  His last stress test was about 3 or 4 years ago he was told it was normal.  He has had a pulmonary function test which was read as normal. He currently only takes Symbicort 1 puff twice a day. He says that albuterol has helped him quite a bit and the past.  Past Medical History  Diagnosis Date  . Arthritis   . MRSA (methicillin resistant Staphylococcus aureus)   . Cellulitis of abdominal wall   . Walking pneumonia 06/29/2009  . Mass     recurrent chronically infected masses   . Diverticulitis   . Coronary artery disease   . COPD (chronic obstructive pulmonary disease)   . Smoking hx   . S/P CABG x 5   . Hypertension   . H1N1 influenza   . Pneumonia      Family History  Problem Relation Age of Onset  . Emphysema Father   . Allergies Mother   .  Allergies Maternal Grandmother      History   Social History  . Marital Status: Married    Spouse Name: N/A    Number of Children: N/A  . Years of Education: N/A   Occupational History  . Not on file.   Social History Main Topics  . Smoking status: Former Smoker -- 2.00 packs/day for 38 years    Types: Cigarettes    Quit date: 04/22/2002  . Smokeless tobacco: Never Used  . Alcohol Use: No  . Drug Use: No  . Sexual Activity: Not on file   Other Topics Concern  . Not on file   Social History Narrative  . No narrative on file     Allergies  Allergen Reactions  . Doxycycline     Skin rash (family not sure that this is a true reaction. Pt was in the sun when he had the reaction)  . Morphine And Related     Hallucinations   . Penicillins     Childhood reaction     Outpatient Prescriptions Prior to Visit  Medication Sig Dispense Refill  . amLODipine (NORVASC) 5 MG tablet Take 5 mg by mouth daily.       . budesonide-formoterol (SYMBICORT) 160-4.5 MCG/ACT  inhaler Inhale 1 puff into the lungs 2 (two) times daily.       . CRESTOR 5 MG tablet Take 5 mg by mouth at bedtime.       . metFORMIN (GLUCOPHAGE) 500 MG tablet Take 500 mg by mouth at bedtime.      . metoprolol (TOPROL-XL) 100 MG 24 hr tablet Take 100 mg by mouth every morning.       . Omega-3 Fatty Acids (FISH OIL) 1200 MG CAPS Take 2 capsules by mouth daily.       . Multiple Vitamins-Minerals (MULTIVITAMINS THER. W/MINERALS) TABS Take 1 tablet by mouth daily.        . hydrochlorothiazide (HYDRODIURIL) 12.5 MG tablet Take 12.5 mg by mouth daily.       Marland Kitchen sulfamethoxazole-trimethoprim (BACTRIM DS,SEPTRA DS) 800-160 MG per tablet Take 2 tablets by mouth 2 (two) times daily.  28 tablet  2   No facility-administered medications prior to visit.      Review of Systems  Constitutional: Negative for fever and unexpected weight change.  HENT: Positive for congestion and postnasal drip. Negative for dental problem, ear  pain, nosebleeds, rhinorrhea, sinus pressure, sneezing, sore throat and trouble swallowing.   Eyes: Negative for redness and itching.  Respiratory: Positive for cough and shortness of breath. Negative for chest tightness and wheezing.   Cardiovascular: Negative for palpitations and leg swelling.  Gastrointestinal: Negative for nausea and vomiting.  Genitourinary: Negative for dysuria.  Musculoskeletal: Negative for joint swelling.  Skin: Negative for rash.  Neurological: Negative for headaches.  Hematological: Does not bruise/bleed easily.  Psychiatric/Behavioral: Negative for dysphoric mood. The patient is not nervous/anxious.        Objective:   Physical Exam  Filed Vitals:   04/21/13 0956  BP: 140/74  Pulse: 72  Height: 5\' 10"  (1.778 m)  Weight: 235 lb (106.595 kg)  SpO2: 93%   Ambulated 500 feet on RA and O2 saturation dropped to 87% for a few seconds and then recovered  Gen: Obese, no acute distress HEENT: NCAT, PERRL, EOMi, OP clear, neck supple without masses PULM: Few wheezes bilaterally CV: RRR, no mgr, no JVD AB: BS+, soft, nontender, no hsm Ext: warm, trace edema left ankle, no clubbing, no cyanosis Derm: no rash or skin breakdown Neuro: A&Ox4, CN II-XII intact, strength 5/5 in all 4 extremities  2014 PFT> clear airflow obstruction> FEV1 1.10 L (31% pred), TLC 7.55 (113% pred), RV 4.65L (206% pred), DLCO 16.7 (53%pred)     Assessment & Plan:   COPD (chronic obstructive pulmonary disease) Last year's PFTs showed severe COPD.  This is the most likely reason for his dyspnea, though obesity also contributes.  He is currently not taking Symbicort appropriately.  Before adding or changing any medicines I want to work with him to try to maximize benefit from the Symbicort.  His ambulatory O2 saturation dropped briefly on this visit.  Hopefully this will improve with weight loss and proper symbicort use.  If no improvement he will need supplemental  oxygen.  Plan: -CXR -use symbicort two puffs bid with a spacer -Use albuterol prn -check with PCP> what type of pneumonia shot was given -exercise regularly and lose weight -repeat ambulatory O2 saturation on next visit.  Smoking hx He qualifies for lung cancer screening. Will order LDCT for lung cancer screening    Updated Medication List Outpatient Encounter Prescriptions as of 04/21/2013  Medication Sig  . amLODipine (NORVASC) 5 MG tablet Take 5 mg by mouth daily.   Marland Kitchen  budesonide-formoterol (SYMBICORT) 160-4.5 MCG/ACT inhaler Inhale 1 puff into the lungs 2 (two) times daily.   . CRESTOR 5 MG tablet Take 5 mg by mouth at bedtime.   . furosemide (LASIX) 20 MG tablet Take 20 mg by mouth. Takes MWF  . lisinopril (PRINIVIL,ZESTRIL) 10 MG tablet Take 10 mg by mouth daily.  . metFORMIN (GLUCOPHAGE) 500 MG tablet Take 500 mg by mouth at bedtime.  . metoprolol (TOPROL-XL) 100 MG 24 hr tablet Take 100 mg by mouth every morning.   . Omega-3 Fatty Acids (FISH OIL) 1200 MG CAPS Take 2 capsules by mouth daily.   Marland Kitchen. albuterol (PROVENTIL HFA;VENTOLIN HFA) 108 (90 BASE) MCG/ACT inhaler Inhale 2 puffs into the lungs every 6 (six) hours as needed for wheezing or shortness of breath.  . Multiple Vitamins-Minerals (MULTIVITAMINS THER. W/MINERALS) TABS Take 1 tablet by mouth daily.    Marland Kitchen. Spacer/Aero-Holding Chambers (AEROCHAMBER MV) inhaler Use as instructed  . [DISCONTINUED] hydrochlorothiazide (HYDRODIURIL) 12.5 MG tablet Take 12.5 mg by mouth daily.   . [DISCONTINUED] sulfamethoxazole-trimethoprim (BACTRIM DS,SEPTRA DS) 800-160 MG per tablet Take 2 tablets by mouth 2 (two) times daily.

## 2013-04-21 NOTE — Telephone Encounter (Signed)
ATC- no answer and no voicemail at this number. Will need to try again later.

## 2013-04-22 ENCOUNTER — Telehealth: Payer: Self-pay | Admitting: Pulmonary Disease

## 2013-04-22 MED ORDER — MOMETASONE FURO-FORMOTEROL FUM 100-5 MCG/ACT IN AERO: 2.0000 | INHALATION_SPRAY | Freq: Two times a day (BID) | RESPIRATORY_TRACT | Status: DC

## 2013-04-22 NOTE — Telephone Encounter (Signed)
I sent Daniel Norton a note on the CXR, you might check with her to see if she called him already

## 2013-04-22 NOTE — Telephone Encounter (Signed)
Pt already aware of cxr  LMTCB to advise on med changes

## 2013-04-22 NOTE — Telephone Encounter (Signed)
I spoke with the Daniel Norton and he states he spoke with his insurance and they advised him that proair is covered for a cheaper copay and alternatives to symbicort are advair and dulera. Please advise if ok to change the Daniel Norton to one of these meds? Thanks.   The Daniel Norton also wanted to know CXR results and I provided him the results per result note. Carron CurieJennifer Castillo, CMA Allergies  Allergen Reactions  . Doxycycline     Skin rash (family not sure that this is a true reaction. Daniel Norton was in the sun when he had the reaction)  . Morphine And Related     Hallucinations   . Penicillins     Childhood reaction

## 2013-04-22 NOTE — Telephone Encounter (Signed)
Pt is calling back.  Pt states that his insurance will not cover much of anything & supposes he might as well get albuterol.  Pt can be reached at [phone].  Antionette FairyHolly D Pryor

## 2013-04-22 NOTE — Telephone Encounter (Signed)
proAir 2puffs q4hr prn dyspnea Dulera 100/5 2 puffs bid

## 2013-04-22 NOTE — Telephone Encounter (Signed)
Spoke with the pt and he states all the albuterol inhalers are the same price so he is going to stick with the one he has. He does want rx for dulera sent because this will be some cheaper for him. Rx has been sent. Carron CurieJennifer Mardell Cragg, CMA

## 2013-04-22 NOTE — Telephone Encounter (Signed)
I called and spoke w/ pt. Made him aware don't see where anyone has tried calling him since he spoke with nurse earlier. Nothing further needed

## 2013-04-27 ENCOUNTER — Ambulatory Visit (INDEPENDENT_AMBULATORY_CARE_PROVIDER_SITE_OTHER): Payer: Medicare Other | Admitting: General Surgery

## 2013-04-29 ENCOUNTER — Ambulatory Visit (INDEPENDENT_AMBULATORY_CARE_PROVIDER_SITE_OTHER)
Admission: RE | Admit: 2013-04-29 | Discharge: 2013-04-29 | Disposition: A | Discharge status: Home / Self Care | Payer: Medicare Other | Source: Ambulatory Visit | Attending: Pulmonary Disease | Admitting: Pulmonary Disease

## 2013-04-29 ENCOUNTER — Encounter: Payer: Self-pay | Admitting: Pulmonary Disease

## 2013-04-29 ENCOUNTER — Telehealth: Payer: Self-pay

## 2013-04-29 DIAGNOSIS — F17200 Nicotine dependence, unspecified, uncomplicated: Secondary | ICD-10-CM

## 2013-04-29 DIAGNOSIS — J449 Chronic obstructive pulmonary disease, unspecified: Secondary | ICD-10-CM

## 2013-04-29 NOTE — Telephone Encounter (Signed)
Message copied by Velvet BatheAULFIELD, Gurkaran Rahm L on Thu Apr 29, 2013  5:06 PM ------      Message from: Max FickleMCQUAID, DOUGLAS B      Created: Thu Apr 29, 2013  4:51 PM       A,            Please let him know that his CT chest showed emphysema and a very small pulmonary nodule which looks benign.  According to the lung cancer screening protocol, he should have another CT in a year.            Thanks      B ------

## 2013-04-29 NOTE — Telephone Encounter (Signed)
Pt aware of results and recs.  Verified his next appt date with him.  Nothing further needed at this time.

## 2013-05-21 ENCOUNTER — Encounter (INDEPENDENT_AMBULATORY_CARE_PROVIDER_SITE_OTHER): Payer: Self-pay | Admitting: General Surgery

## 2013-05-21 ENCOUNTER — Ambulatory Visit (INDEPENDENT_AMBULATORY_CARE_PROVIDER_SITE_OTHER): Payer: Medicare Other | Admitting: General Surgery

## 2013-05-21 ENCOUNTER — Ambulatory Visit (INDEPENDENT_AMBULATORY_CARE_PROVIDER_SITE_OTHER): Payer: Medicare Other | Admitting: Pulmonary Disease

## 2013-05-21 ENCOUNTER — Encounter: Payer: Self-pay | Admitting: Pulmonary Disease

## 2013-05-21 VITALS — BP 136/72 | HR 72 | Resp 18 | Ht 70.0 in | Wt 233.4 lb

## 2013-05-21 VITALS — BP 140/76 | HR 78 | Ht 70.0 in | Wt 236.0 lb

## 2013-05-21 DIAGNOSIS — J4489 Other specified chronic obstructive pulmonary disease: Secondary | ICD-10-CM

## 2013-05-21 DIAGNOSIS — J449 Chronic obstructive pulmonary disease, unspecified: Secondary | ICD-10-CM

## 2013-05-21 DIAGNOSIS — T148XXA Other injury of unspecified body region, initial encounter: Secondary | ICD-10-CM

## 2013-05-21 DIAGNOSIS — S31109A Unspecified open wound of abdominal wall, unspecified quadrant without penetration into peritoneal cavity, initial encounter: Principal | ICD-10-CM

## 2013-05-21 DIAGNOSIS — L089 Local infection of the skin and subcutaneous tissue, unspecified: Secondary | ICD-10-CM

## 2013-05-21 NOTE — Progress Notes (Signed)
   Subjective:    Patient ID: Daniel Norton, male    DOB: 05/09/1950, [age] y.o.   MRN: [id number]  Synopsis: 63 y/o male with severe COPD and obesity.  GOLD Grade D 2014 PFT> clear airflow obstruction> FEV1 1.10 L (31% pred), TLC 7.55 (113% pred), RV 4.65L (206% pred), DLCO 16.7 (53%pred)   HPI  05/21/2013 ROV > Daniel Norton thinks that he is doing better.  His cough has improved since the last visit.  He thinks that the mucus has decreased good.  He notes that he has had a lot of sinus congestion and drainage.  This has gotten better lately.    Past Medical History  Diagnosis Date  . Arthritis   . MRSA (methicillin resistant Staphylococcus aureus)   . Cellulitis of abdominal wall   . Walking pneumonia 06/29/2009  . Mass     recurrent chronically infected masses   . Diverticulitis   . Coronary artery disease   . COPD (chronic obstructive pulmonary disease)   . Smoking hx   . S/P CABG x 5   . Hypertension   . H1N1 influenza   . Pneumonia      Review of Systems     Objective:   Physical Exam Filed Vitals:   05/21/13 0855  BP: 140/76  Pulse: 78  Height: 5\' 10"  (1.778 m)  Weight: 236 lb (107.049 kg)  SpO2: 95%   RA  Gen: obese, no acute distress HEENT: NCAT, EOMi, OP clear PULM: CTA B CV: RRR, no mgr, no JVD AB: BS+, soft, nontender, no hsm Ext: warm, no edema, no clubbing, no cyanosis      Assessment & Plan:   COPD (chronic obstructive pulmonary disease) This has been a stable interval for Daniel Norton.  He does well on the normal dose of a combination inhaler.  Plan: -continue dulera -flu shot in fall -f/u one year or sooner if needed    Updated Medication List Outpatient Encounter Prescriptions as of 05/21/2013  Medication Sig  . albuterol (PROVENTIL HFA;VENTOLIN HFA) 108 (90 BASE) MCG/ACT inhaler Inhale 2 puffs into the lungs every 6 (six) hours as needed for wheezing or shortness of breath.  Marland Kitchen. amLODipine (NORVASC) 5 MG tablet Take 5 mg by mouth daily.   .  budesonide-formoterol (SYMBICORT) 160-4.5 MCG/ACT inhaler Inhale 2 puffs into the lungs 2 (two) times daily.   . CRESTOR 5 MG tablet Take 5 mg by mouth at bedtime.   . furosemide (LASIX) 20 MG tablet Take 20 mg by mouth. Takes MWF  . lisinopril (PRINIVIL,ZESTRIL) 10 MG tablet Take 10 mg by mouth daily.  . metFORMIN (GLUCOPHAGE) 500 MG tablet Take 500 mg by mouth 2 (two) times daily.   . metoprolol (TOPROL-XL) 100 MG 24 hr tablet Take 100 mg by mouth every morning.   . Omega-3 Fatty Acids (FISH OIL) 1200 MG CAPS Take 2 capsules by mouth daily.   Marland Kitchen. Spacer/Aero-Holding Chambers (AEROCHAMBER MV) inhaler Use as instructed  . mometasone-formoterol (DULERA) 100-5 MCG/ACT AERO Inhale 2 puffs into the lungs 2 (two) times daily.  . [DISCONTINUED] Multiple Vitamins-Minerals (MULTIVITAMINS THER. W/MINERALS) TABS Take 1 tablet by mouth daily.

## 2013-05-21 NOTE — Patient Instructions (Signed)
For your COPD: [facility] taking the [facility] when you run out of your symbicort samples Let us know if you need a 90 prescription  For your sinuses: Increase the nasacort to 2 puffs each nostril at least three days a week, keep one puff each nostril the other days Take generic zyrtec Take saline rinses when things are bad  Lose weight Get a flu shot  Come back in a year or sooner if needed

## 2013-05-24 NOTE — Assessment & Plan Note (Addendum)
This has been a stable interval for Daniel Norton.  He does well on Daniel normal dose of a combination inhaler.  Plan: -continue dulera -flu shot in fall -f/u one year or sooner if needed

## 2013-05-27 ENCOUNTER — Ambulatory Visit: Payer: Medicare Other | Admitting: Pulmonary Disease

## 2013-06-01 NOTE — Progress Notes (Signed)
Subjective:     Patient ID: Daniel SorensonFrank T Norton, male   DOB: 04/17/1950, 63 y.o.   MRN: [id number]  HPI The patient is a 63 year old white male who is a long-term patient of mine. 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 He has had a ventral hernia repair with mesh and has had some exposed edge of mesh on his abdominal wound. Periodically he will get some redness associated with it. Since his last visit though he has been reasonably well. His car has been very stable and he has had no episodes of cellulitis. He does have a little bit more discomfort associated with his abdominal scar. He feels like he can only eat several small meals a day instead of big meals now. He has been maintaining his weight. He has not had any nausea or vomiting. He continues to have issues with his breathing on his heart which are managed by his medical doctors.  Review of Systems  Constitutional: Negative.   HENT: Negative.   Eyes: Negative.   Respiratory: Negative.   Cardiovascular: Negative.   Gastrointestinal: Negative.   Endocrine: Negative.   Genitourinary: Negative.   Musculoskeletal: Negative.   Skin: Negative.   Neurological: Negative.   Hematological: Negative.   Psychiatric/Behavioral: Negative.        Objective:   Physical Exam  Constitutional: He is oriented to person, place, and time. He appears well-developed and well-nourished.  HENT:  Head: Normocephalic and atraumatic.  Eyes: Conjunctivae and EOM are normal. Pupils are equal, round, and reactive to light.  Neck: Normal range of motion. Neck supple.  Cardiovascular: Normal rate, regular rhythm and normal heart sounds.   Pulmonary/Chest: Effort normal and breath sounds normal.  Abdominal: Soft. Bowel sounds are normal.  His abdominal scar is stable with no evidence of cellulitis. His abdomen is soft and nontender.  Musculoskeletal: Normal range of motion.  Neurological: He is alert and  oriented to person, place, and time.  Skin: Skin is warm and dry.  Psychiatric: He has a normal mood and affect. His behavior is normal.       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 He has not had any episodes of cellulitis since his last visit.     Plan:     At this point he will continue to try to exercise according to his medical doctors. I will plan to see him back in about 6 months to check his progress.

## 2013-12-13 ENCOUNTER — Encounter (HOSPITAL_COMMUNITY): Payer: Self-pay | Admitting: Emergency Medicine

## 2013-12-13 ENCOUNTER — Inpatient Hospital Stay (HOSPITAL_COMMUNITY)
Admission: EM | Admit: 2013-12-13 | Discharge: 2013-12-19 | DRG: 378 | Disposition: A | Discharge status: Home / Self Care | Payer: BC Managed Care – PPO | Attending: Family Medicine | Admitting: Family Medicine

## 2013-12-13 ENCOUNTER — Telehealth (INDEPENDENT_AMBULATORY_CARE_PROVIDER_SITE_OTHER): Payer: Self-pay | Admitting: Surgery

## 2013-12-13 ENCOUNTER — Emergency Department (HOSPITAL_COMMUNITY): Payer: BC Managed Care – PPO

## 2013-12-13 ENCOUNTER — Other Ambulatory Visit (INDEPENDENT_AMBULATORY_CARE_PROVIDER_SITE_OTHER): Payer: Self-pay

## 2013-12-13 DIAGNOSIS — M1990 Unspecified osteoarthritis, unspecified site: Secondary | ICD-10-CM | POA: Diagnosis present

## 2013-12-13 DIAGNOSIS — R1030 Lower abdominal pain, unspecified: Secondary | ICD-10-CM

## 2013-12-13 DIAGNOSIS — I5032 Chronic diastolic (congestive) heart failure: Secondary | ICD-10-CM | POA: Diagnosis present

## 2013-12-13 DIAGNOSIS — Z9049 Acquired absence of other specified parts of digestive tract: Secondary | ICD-10-CM | POA: Diagnosis present

## 2013-12-13 DIAGNOSIS — Z881 Allergy status to other antibiotic agents status: Secondary | ICD-10-CM

## 2013-12-13 DIAGNOSIS — E119 Type 2 diabetes mellitus without complications: Secondary | ICD-10-CM | POA: Diagnosis present

## 2013-12-13 DIAGNOSIS — D62 Acute posthemorrhagic anemia: Secondary | ICD-10-CM | POA: Diagnosis present

## 2013-12-13 DIAGNOSIS — Z885 Allergy status to narcotic agent status: Secondary | ICD-10-CM

## 2013-12-13 DIAGNOSIS — J449 Chronic obstructive pulmonary disease, unspecified: Secondary | ICD-10-CM | POA: Diagnosis present

## 2013-12-13 DIAGNOSIS — K922 Gastrointestinal hemorrhage, unspecified: Secondary | ICD-10-CM

## 2013-12-13 DIAGNOSIS — Z951 Presence of aortocoronary bypass graft: Secondary | ICD-10-CM

## 2013-12-13 DIAGNOSIS — I10 Essential (primary) hypertension: Secondary | ICD-10-CM | POA: Diagnosis present

## 2013-12-13 DIAGNOSIS — K921 Melena: Secondary | ICD-10-CM

## 2013-12-13 DIAGNOSIS — Z825 Family history of asthma and other chronic lower respiratory diseases: Secondary | ICD-10-CM

## 2013-12-13 DIAGNOSIS — Z880 Allergy status to penicillin: Secondary | ICD-10-CM

## 2013-12-13 DIAGNOSIS — I2510 Atherosclerotic heart disease of native coronary artery without angina pectoris: Secondary | ICD-10-CM | POA: Diagnosis present

## 2013-12-13 DIAGNOSIS — K625 Hemorrhage of anus and rectum: Secondary | ICD-10-CM | POA: Diagnosis not present

## 2013-12-13 DIAGNOSIS — D649 Anemia, unspecified: Secondary | ICD-10-CM

## 2013-12-13 DIAGNOSIS — E785 Hyperlipidemia, unspecified: Secondary | ICD-10-CM | POA: Diagnosis present

## 2013-12-13 DIAGNOSIS — Z87891 Personal history of nicotine dependence: Secondary | ICD-10-CM

## 2013-12-13 DIAGNOSIS — K5731 Diverticulosis of large intestine without perforation or abscess with bleeding: Secondary | ICD-10-CM

## 2013-12-13 HISTORY — DX: Adverse effect of unspecified anesthetic, initial encounter: T41.45XA

## 2013-12-13 HISTORY — DX: Other complications of anesthesia, initial encounter: T88.59XA

## 2013-12-13 LAB — I-STAT CHEM 8, ED
BUN: 34 mg/dL — ABNORMAL HIGH (ref 6–23)
CALCIUM ION: 1.11 mmol/L — AB (ref 1.13–1.30)
Chloride: 101 mEq/L (ref 96–112)
Creatinine, Ser: 0.9 mg/dL (ref 0.50–1.35)
Glucose, Bld: 153 mg/dL — ABNORMAL HIGH (ref 70–99)
HCT: 39 % (ref 39.0–52.0)
Hemoglobin: 13.3 g/dL (ref 13.0–17.0)
Potassium: 4.2 mEq/L (ref 3.7–5.3)
Sodium: 142 mEq/L (ref 137–147)
TCO2: 26 mmol/L (ref 0–100)

## 2013-12-13 LAB — CBC WITH DIFFERENTIAL/PLATELET
Basophils Absolute: 0.1 10*3/uL (ref 0.0–0.1)
Basophils Relative: 1 % (ref 0–1)
EOS ABS: 0.3 10*3/uL (ref 0.0–0.7)
EOS PCT: 1 % (ref 0–5)
HCT: 36.9 % — ABNORMAL LOW (ref 39.0–52.0)
HEMOGLOBIN: 12.1 g/dL — AB (ref 13.0–17.0)
LYMPHS ABS: 3.2 10*3/uL (ref 0.7–4.0)
Lymphocytes Relative: 26 % (ref 12–46)
MCH: 29.2 pg (ref 26.0–34.0)
MCHC: 32.8 g/dL (ref 30.0–36.0)
MCV: 88.9 fL (ref 78.0–100.0)
MONOS PCT: 8 % (ref 3–12)
Monocytes Absolute: 0.9 10*3/uL (ref 0.1–1.0)
Neutro Abs: 7.8 10*3/uL — ABNORMAL HIGH (ref 1.7–7.7)
Neutrophils Relative %: 63 % (ref 43–77)
PLATELETS: 327 10*3/uL (ref 150–400)
RBC: 4.15 MIL/uL — AB (ref 4.22–5.81)
RDW: 13.1 % (ref 11.5–15.5)
WBC: 12.3 10*3/uL — ABNORMAL HIGH (ref 4.0–10.5)
nRBC: 0 /100 WBC

## 2013-12-13 MED ORDER — FENTANYL CITRATE 0.05 MG/ML IJ SOLN
50.0000 ug | Freq: Once | INTRAMUSCULAR | Status: DC
  Filled 2013-12-13: qty 2

## 2013-12-13 MED ORDER — SODIUM CHLORIDE 0.9 % IV BOLUS (SEPSIS)
500.0000 mL | Freq: Once | INTRAVENOUS | Status: AC
  Administered 2013-12-14: 500 mL via INTRAVENOUS

## 2013-12-13 MED ORDER — IOHEXOL 300 MG/ML  SOLN
100.0000 mL | Freq: Once | INTRAMUSCULAR | Status: AC | PRN
  Administered 2013-12-13: 100 mL via INTRAVENOUS

## 2013-12-13 MED ORDER — ONDANSETRON HCL 4 MG/2ML IJ SOLN
4.0000 mg | Freq: Once | INTRAMUSCULAR | Status: DC
  Filled 2013-12-13: qty 2

## 2013-12-13 NOTE — ED Provider Notes (Signed)
CSN: [id number]     Arrival date & time 12/13/13  2103 History   First MD Initiated Contact with Patient 12/13/13 2139     Chief Complaint  Patient presents with  . Rectal Bleeding     (Consider location/radiation/quality/duration/timing/severity/associated sxs/prior Treatment) HPI   Patient with hx DM, HTN, CAD, COPD, diverticulitis and multiple abdominal surgeries presents with lower abdominal pain, dark black malodorous stool with red blood and diarrhea.  The abdominal pain is described as aching, soreness, 5/10 intensity, located in lower abdomen, no radiation.   Pt did do some heavy lifting 2 weeks ago and has had aching pain in his lower abdomen since then.  The dark black stool started this morning - at the time he had a solid BM and this was dark charcoal black and malodorous.  The next BM was soft with some blood, then he had 4-5 more episodes of black liquid bowel movements.  Associated nausea.  Denies fevers, vomiting, urinary symptoms, testicular/groin pain. 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  He called [facility] Dr Carolynne Edouard who ordered blood work, later called back and spoke with Dr Ezzard Standing who advised being evaluated in ED.   Pt reports he had colonoscopy decades ago for unknown reason.  No current GI doctor.  Family members see Dr Ewing Schlein.  No known hx PUD.    Surgeon: Dr Carolynne Edouard.  PCP Dr Lysbeth Galas, [place], [place].    Past Medical History  Diagnosis Date  . Arthritis   . MRSA (methicillin resistant Staphylococcus aureus)   . Cellulitis of abdominal wall   . Walking pneumonia 06/29/2009  . Mass     recurrent chronically infected masses   . Diverticulitis   . Coronary artery disease   . COPD (chronic obstructive pulmonary disease)   . Smoking hx   . S/P CABG x 5   . Hypertension   . H1N1 influenza   . Pneumonia     Past Surgical History  Procedure Laterality Date  . Coronary artery bypass graft    . 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 Bowel resection  may 2010  . Colectomy      sigmoid colectomy  . Colostomy    . Colostomy takedown    . Hernia repair      ventral hernia    Family History  Problem Relation Age of Onset  . Emphysema Father   . Allergies Mother   . Allergies Maternal Grandmother    History  Substance Use Topics  . Smoking status: Former Smoker -- 2.00 packs/day for 38 years    Types: Cigarettes    Quit date: 04/22/2002  . Smokeless tobacco: Never Used  . Alcohol Use: No    Review of Systems  All other systems reviewed and are negative.     Allergies  Doxycycline; Morphine and related; and Penicillins  Home Medications   Prior to Admission medications   Medication Sig Start Date End Date Taking? Authorizing Provider  albuterol (PROVENTIL HFA;VENTOLIN HFA) 108 (90 BASE) MCG/ACT inhaler Inhale 2 puffs into the lungs every 6 (six) hours as needed for wheezing or shortness of breath. 04/21/13   Lupita Leash, MD  amLODipine (NORVASC) 5 MG tablet Take 5 mg by mouth daily.  09/12/10   Historical Provider, MD  budesonide-formoterol (SYMBICORT) 160-4.5 MCG/ACT inhaler Inhale 2 puffs into the lungs 2 (two) times daily.  Historical Provider, MD  CRESTOR 5 MG tablet Take 5 mg by mouth at bedtime.  09/12/10   Historical Provider, MD  furosemide (LASIX) 20 MG tablet Take 20 mg by mouth. Takes MWF    Historical Provider, MD  lisinopril (PRINIVIL,ZESTRIL) 10 MG tablet Take 10 mg by mouth daily.    Historical Provider, MD  metFORMIN (GLUCOPHAGE) 500 MG tablet Take 500 mg by mouth 2 (two) times daily.     Historical Provider, MD  metoprolol (TOPROL-XL) 100 MG 24 hr tablet Take 100 mg by mouth every morning.  09/02/10   Historical Provider, MD  mometasone-formoterol (DULERA) 100-5 MCG/ACT AERO Inhale 2 puffs into the lungs 2 (two) times daily. 04/22/13   Lupita Leashouglas B  McQuaid, MD  Omega-3 Fatty Acids (FISH OIL) 1200 MG CAPS Take 2 capsules by mouth daily.     Historical Provider, MD  Spacer/Aero-Holding Chambers (AEROCHAMBER MV) inhaler Use as instructed 04/21/13   Lupita Leashouglas B McQuaid, MD   BP 125/76 mmHg  Pulse 96  Temp(Src) 97.8 F (36.6 C) (Oral)  Resp 20  Ht 5\' 10"  (1.778 m)  Wt 220 lb (99.791 kg)  BMI 31.57 kg/m2  SpO2 95% Physical Exam  Constitutional: He appears well-developed and well-nourished. No distress.  HENT:  Head: Normocephalic and atraumatic.  Neck: Neck supple.  Cardiovascular: Normal rate and regular rhythm.   Pulmonary/Chest: Effort normal and breath sounds normal. No respiratory distress. He has no wheezes. He has no rales.  Abdominal: Soft. He exhibits no distension and no mass. There is tenderness in the right lower quadrant, suprapubic area and left lower quadrant. There is no rebound and no guarding.  Multiple large surgical incisions, well healed  Genitourinary: Rectal exam shows internal hemorrhoid. Rectal exam shows no mass, no tenderness and anal tone normal.  Gross dark blood on glove  Neurological: He is alert. He exhibits normal muscle tone.  Skin: He is not diaphoretic.  Nursing note and vitals reviewed.   ED Course  Procedures (including critical care time) Labs Review Labs Reviewed  CBC WITH DIFFERENTIAL - Abnormal; Notable for the following:    WBC 12.3 (*)    RBC 4.15 (*)    Hemoglobin 12.1 (*)    HCT 36.9 (*)    Neutro Abs 7.8 (*)    All other components within normal limits  I-STAT CHEM 8, ED - Abnormal; Notable for the following:    BUN 34 (*)    Glucose, Bld 153 (*)    Calcium, Ion 1.11 (*)    All other components within normal limits    Imaging Review Ct Abdomen Pelvis W Contrast  12/14/2013   CLINICAL DATA:  Acute onset of rectal bleeding and bilateral lower quadrant abdominal pain. Diarrhea. Initial encounter.  EXAM: CT ABDOMEN AND PELVIS WITH CONTRAST  TECHNIQUE: Multidetector CT imaging of  the abdomen and pelvis was performed using the standard protocol following bolus administration of intravenous contrast.  CONTRAST:  100mL OMNIPAQUE IOHEXOL 300 MG/ML  SOLN  COMPARISON:  CT of the abdomen and pelvis performed 11/04/2008  FINDINGS: The visualized lung bases are clear. Diffuse coronary artery calcification is seen.  The liver and spleen are unremarkable in appearance. The gallbladder is within normal limits. A relatively stable 1.5 cm adrenal adenoma is noted arising at the left adrenal gland. The right adrenal gland is unremarkable in appearance. The pancreas is within normal limits. Incidental note is made of a duodenal diverticulum at the head of the pancreas.  Nonspecific perinephric stranding is noted  bilaterally. Scattered small bilateral renal cysts are seen. A prominent column of Bertin is noted on the right side. The kidneys are otherwise grossly unremarkable. There is no evidence of hydronephrosis. No renal or ureteral stones are seen.  No free fluid is identified. The small bowel is unremarkable in appearance. The stomach is within normal limits. No acute vascular abnormalities are seen. Mild scattered calcification is seen along the abdominal aorta and its branches.  The appendix is normal in caliber and contains air, without evidence for appendicitis. Mild diverticulosis is noted along the distal descending and proximal sigmoid colon, without evidence of diverticulitis. A few tiny foci of increased attenuation at the proximal sigmoid colon are grossly stable from 2010 and may reflect chronic calcification. The colon is otherwise unremarkable.  The bladder is decompressed and not well assessed. The prostate remains normal in size, with scattered calcification. No inguinal lymphadenopathy is seen.  No acute osseous abnormalities are identified. Vacuum phenomenon is noted at L5-S1. Facet disease is noted at the lower lumbar spine.  IMPRESSION: 1. Mild diverticulosis along the distal  descending and proximal sigmoid colon, without evidence of diverticulitis. No focal source of bleeding is identified on CT. 2. Diffuse coronary artery calcification seen. 3. Stable 1.5 cm adrenal adenoma at the left adrenal gland. 4. 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 5. Scattered small bilateral renal cysts seen. 6. Mild scattered calcification along the abdominal aorta and its branches.   Electronically Signed   By: Roanna RaiderJeffery  Chang M.D.   On: 12/14/2013 00:20     EKG Interpretation None        1:04 AM Pt currently reports he is pain free, feeling better, no lightheadedness with ambulation. Reports two more episodes of rectal bleeding while in ED, reports small volumes, liquid stool.   Orthostatics, performed by me, just prior to 1:00am Lying HR 85, BP 108/66 Sitting HR 93, BP 108/69 Standing HR 92, BP 129/67   MDM   Final diagnoses:  Rectal bleeding  Diverticulosis of large intestine with hemorrhage  Mild anemia  Lower abdominal pain    Afebrile nontoxic patient with lower abdominal pain and rectal bleeding.  Gross blood on rectal exam.  Leukocytosis, mild anemia, BUN elevated on I-stat chem 8, likely mild dehydration.  CT abd/pelvis shows diverticulosis.  No focal source of bleeding on CT.  Pt feeling better in ED, declined pain medication.  Pt with two further episodes of rectal bleeding while in ED.  While pt's vital signs are stable and he is not orthostatic, he continues to have rectal bleeding, which concerns me.  Discussed possible admission for overnight observation with the patient.  Pt lives 40 minutes away from the ED and would not be able to return quickly for worsening condition.  No current GI doctor.  Admitted to [facility] for overnight observation.  Discussed pt with Dr Clyde LundborgNiu.     VanceboroEmily Laporscha Linehan, PA-C 12/14/13 [id number]  Geoffery Lyonsouglas Delo, MD 12/14/13 [phone]

## 2013-12-13 NOTE — ED Notes (Signed)
Pt still in CT

## 2013-12-13 NOTE — ED Notes (Signed)
Pt c/o rectal bleeding onset this am.  St's has had several stools today.  Also c/o pain in mid to lower abd.  Nausea without vomiting

## 2013-12-13 NOTE — ED Notes (Signed)
Pt was up to the restroom, after getting back reported that he was feeling lightheaded and having increased pain, after lying down lightheadedness and pain resolved

## 2013-12-13 NOTE — ED Notes (Signed)
Patient to ED with C/O lower abdominal pain and Blood in his stool.  States that his stools are dark and foul smelling

## 2013-12-13 NOTE — ED Notes (Signed)
Pt's wife in room helping pt change into gown. Family informed to ask staff for assistance if neede.

## 2013-12-13 NOTE — Telephone Encounter (Signed)
[name] called about pasing dark blood and red blood from his rectum.  He called Daniel Norton because he has seen Dr. Carolynne Norton in the past for colon and hernia surgery.  He has had some vague lower abdominal discomfort, but no other symptoms.  He is not light headed, not nauseated, or febrile.  He spoke to our office today - but there is not a note in [employer].  He did get a CBC done, but there are no results in Epic.  Dr. Lysbeth Norton is his PCP.  He does not see a GI doctor.  He is going to the ER to be evaluated.  Daniel Kinavid Teghan Philbin, MD, [facility]

## 2013-12-14 ENCOUNTER — Encounter (HOSPITAL_COMMUNITY)
Admission: EM | Disposition: A | Discharge status: Home / Self Care | Payer: Self-pay | Source: Home / Self Care | Attending: Family Medicine

## 2013-12-14 ENCOUNTER — Encounter (HOSPITAL_COMMUNITY): Payer: Self-pay | Admitting: *Deleted

## 2013-12-14 ENCOUNTER — Other Ambulatory Visit: Payer: Self-pay

## 2013-12-14 DIAGNOSIS — Z9049 Acquired absence of other specified parts of digestive tract: Secondary | ICD-10-CM | POA: Diagnosis present

## 2013-12-14 DIAGNOSIS — Z951 Presence of aortocoronary bypass graft: Secondary | ICD-10-CM | POA: Diagnosis not present

## 2013-12-14 DIAGNOSIS — Z881 Allergy status to other antibiotic agents status: Secondary | ICD-10-CM | POA: Diagnosis not present

## 2013-12-14 DIAGNOSIS — J449 Chronic obstructive pulmonary disease, unspecified: Secondary | ICD-10-CM | POA: Diagnosis present

## 2013-12-14 DIAGNOSIS — I2510 Atherosclerotic heart disease of native coronary artery without angina pectoris: Secondary | ICD-10-CM | POA: Diagnosis present

## 2013-12-14 DIAGNOSIS — Z825 Family history of asthma and other chronic lower respiratory diseases: Secondary | ICD-10-CM | POA: Diagnosis not present

## 2013-12-14 DIAGNOSIS — E119 Type 2 diabetes mellitus without complications: Secondary | ICD-10-CM | POA: Diagnosis present

## 2013-12-14 DIAGNOSIS — Z885 Allergy status to narcotic agent status: Secondary | ICD-10-CM | POA: Diagnosis not present

## 2013-12-14 DIAGNOSIS — K625 Hemorrhage of anus and rectum: Secondary | ICD-10-CM | POA: Diagnosis present

## 2013-12-14 DIAGNOSIS — E785 Hyperlipidemia, unspecified: Secondary | ICD-10-CM | POA: Diagnosis present

## 2013-12-14 DIAGNOSIS — I5032 Chronic diastolic (congestive) heart failure: Secondary | ICD-10-CM | POA: Diagnosis present

## 2013-12-14 DIAGNOSIS — K921 Melena: Secondary | ICD-10-CM | POA: Diagnosis present

## 2013-12-14 DIAGNOSIS — Z880 Allergy status to penicillin: Secondary | ICD-10-CM | POA: Diagnosis not present

## 2013-12-14 DIAGNOSIS — D62 Acute posthemorrhagic anemia: Secondary | ICD-10-CM | POA: Diagnosis present

## 2013-12-14 DIAGNOSIS — I10 Essential (primary) hypertension: Secondary | ICD-10-CM | POA: Diagnosis present

## 2013-12-14 DIAGNOSIS — M1990 Unspecified osteoarthritis, unspecified site: Secondary | ICD-10-CM | POA: Diagnosis present

## 2013-12-14 DIAGNOSIS — Z87891 Personal history of nicotine dependence: Secondary | ICD-10-CM | POA: Diagnosis not present

## 2013-12-14 HISTORY — PX: ESOPHAGOGASTRODUODENOSCOPY: SHX5428

## 2013-12-14 LAB — CBC
HCT: 30.5 % — ABNORMAL LOW (ref 39.0–52.0)
HCT: 30.5 % — ABNORMAL LOW (ref 39.0–52.0)
HEMATOCRIT: 29.6 % — AB (ref 39.0–52.0)
HEMATOCRIT: 27.5 % — AB (ref 39.0–52.0)
HEMOGLOBIN: 9.8 g/dL — AB (ref 13.0–17.0)
HEMOGLOBIN: 8.9 g/dL — AB (ref 13.0–17.0)
Hemoglobin: 10 g/dL — ABNORMAL LOW (ref 13.0–17.0)
Hemoglobin: 9.7 g/dL — ABNORMAL LOW (ref 13.0–17.0)
MCH: 29.8 pg (ref 26.0–34.0)
MCH: 29 pg (ref 26.0–34.0)
MCH: 28.6 pg (ref 26.0–34.0)
MCH: 28.6 pg (ref 26.0–34.0)
MCHC: 32.8 g/dL (ref 30.0–36.0)
MCHC: 32.8 g/dL (ref 30.0–36.0)
MCHC: 32.1 g/dL (ref 30.0–36.0)
MCHC: 32.4 g/dL (ref 30.0–36.0)
MCV: 90.8 fL (ref 78.0–100.0)
MCV: 88.6 fL (ref 78.0–100.0)
MCV: 88.9 fL (ref 78.0–100.0)
MCV: 88.4 fL (ref 78.0–100.0)
PLATELETS: 272 10*3/uL (ref 150–400)
PLATELETS: 266 10*3/uL (ref 150–400)
Platelets: 258 10*3/uL (ref 150–400)
Platelets: 263 10*3/uL (ref 150–400)
RBC: 3.36 MIL/uL — AB (ref 4.22–5.81)
RBC: 3.34 MIL/uL — ABNORMAL LOW (ref 4.22–5.81)
RBC: 3.43 MIL/uL — AB (ref 4.22–5.81)
RBC: 3.11 MIL/uL — AB (ref 4.22–5.81)
RDW: 13.2 % (ref 11.5–15.5)
RDW: 13.4 % (ref 11.5–15.5)
RDW: 13.5 % (ref 11.5–15.5)
RDW: 13.5 % (ref 11.5–15.5)
WBC: 11.8 10*3/uL — ABNORMAL HIGH (ref 4.0–10.5)
WBC: 10.1 10*3/uL (ref 4.0–10.5)
WBC: 10.1 10*3/uL (ref 4.0–10.5)
WBC: 11.9 10*3/uL — ABNORMAL HIGH (ref 4.0–10.5)

## 2013-12-14 LAB — POC OCCULT BLOOD, ED: FECAL OCCULT BLD: POSITIVE — AB

## 2013-12-14 LAB — PROTIME-INR
INR: 1.1 (ref 0.00–1.49)
Prothrombin Time: 14.3 seconds (ref 11.6–15.2)

## 2013-12-14 LAB — COMPREHENSIVE METABOLIC PANEL
ALT: 14 U/L (ref 0–53)
ANION GAP: 8 (ref 5–15)
AST: 11 U/L (ref 0–37)
Albumin: 2.7 g/dL — ABNORMAL LOW (ref 3.5–5.2)
Alkaline Phosphatase: 39 U/L (ref 39–117)
BUN: 32 mg/dL — AB (ref 6–23)
CHLORIDE: 107 meq/L (ref 96–112)
CO2: 27 meq/L (ref 19–32)
CREATININE: 0.76 mg/dL (ref 0.50–1.35)
Calcium: 8 mg/dL — ABNORMAL LOW (ref 8.4–10.5)
GFR calc Af Amer: >90 mL/min (ref >90)
Glucose, Bld: 142 mg/dL — ABNORMAL HIGH (ref 70–99)
Potassium: 4.2 mEq/L (ref 3.7–5.3)
Sodium: 142 mEq/L (ref 137–147)
Total Protein: 5.5 g/dL — ABNORMAL LOW (ref 6.0–8.3)

## 2013-12-14 LAB — GLUCOSE, CAPILLARY
Glucose-Capillary: 182 mg/dL — ABNORMAL HIGH (ref 70–99)
Glucose-Capillary: 173 mg/dL — ABNORMAL HIGH (ref 70–99)

## 2013-12-14 LAB — HEMOGLOBIN A1C
HEMOGLOBIN A1C: 7.7 % — AB (ref <5.7)
Mean Plasma Glucose: 174 mg/dL — ABNORMAL HIGH (ref <117)

## 2013-12-14 LAB — ABO/RH: ABO/RH(D): A POS

## 2013-12-14 SURGERY — EGD (ESOPHAGOGASTRODUODENOSCOPY)
Anesthesia: Moderate Sedation

## 2013-12-14 MED ORDER — ONDANSETRON HCL 4 MG/2ML IJ SOLN: 4.0000 mg | Freq: Four times a day (QID) | INTRAMUSCULAR | Status: DC | PRN

## 2013-12-14 MED ORDER — PEG 3350-KCL-NA BICARB-NACL 420 G PO SOLR
4000.0000 mL | Freq: Once | ORAL | Status: AC
  Administered 2013-12-14: 4000 mL via ORAL
  Filled 2013-12-14: qty 4000

## 2013-12-14 MED ORDER — INSULIN ASPART 100 UNIT/ML ~~LOC~~ SOLN
0.0000 [IU] | Freq: Three times a day (TID) | SUBCUTANEOUS | Status: DC
  Administered 2013-12-14 – 2013-12-15 (×3): 2 [IU] via SUBCUTANEOUS
  Administered 2013-12-15 – 2013-12-17 (×4): 1 [IU] via SUBCUTANEOUS
  Administered 2013-12-17 – 2013-12-18 (×2): 2 [IU] via SUBCUTANEOUS
  Administered 2013-12-18: 1 [IU] via SUBCUTANEOUS

## 2013-12-14 MED ORDER — METOPROLOL SUCCINATE ER 100 MG PO TB24
100.0000 mg | ORAL_TABLET | Freq: Every day | ORAL | Status: DC
  Administered 2013-12-14 – 2013-12-19 (×6): 100 mg via ORAL
  Filled 2013-12-14 (×7): qty 1

## 2013-12-14 MED ORDER — ROSUVASTATIN CALCIUM 5 MG PO TABS
5.0000 mg | ORAL_TABLET | Freq: Every day | ORAL | Status: DC
  Administered 2013-12-14 – 2013-12-18 (×5): 5 mg via ORAL
  Filled 2013-12-14 (×6): qty 1

## 2013-12-14 MED ORDER — ONDANSETRON HCL 4 MG PO TABS: 4.0000 mg | ORAL_TABLET | Freq: Four times a day (QID) | ORAL | Status: DC | PRN

## 2013-12-14 MED ORDER — BUTAMBEN-TETRACAINE-BENZOCAINE 2-2-14 % EX AERO
INHALATION_SPRAY | CUTANEOUS | Status: DC | PRN
  Administered 2013-12-14: 2 via TOPICAL

## 2013-12-14 MED ORDER — SODIUM CHLORIDE 0.9 % IV SOLN
INTRAVENOUS | Status: DC
  Administered 2013-12-14 – 2013-12-15 (×3): via INTRAVENOUS

## 2013-12-14 MED ORDER — FENTANYL CITRATE 0.05 MG/ML IJ SOLN
INTRAMUSCULAR | Status: DC | PRN
  Administered 2013-12-14 (×2): 25 ug via INTRAVENOUS

## 2013-12-14 MED ORDER — PANTOPRAZOLE SODIUM 40 MG IV SOLR
40.0000 mg | Freq: Two times a day (BID) | INTRAVENOUS | Status: DC
  Administered 2013-12-14 – 2013-12-17 (×9): 40 mg via INTRAVENOUS
  Filled 2013-12-14 (×12): qty 40

## 2013-12-14 MED ORDER — SODIUM CHLORIDE 0.9 % IV SOLN
INTRAVENOUS | Status: AC
  Administered 2013-12-14: 03:00:00 via INTRAVENOUS

## 2013-12-14 MED ORDER — BUDESONIDE-FORMOTEROL FUMARATE 160-4.5 MCG/ACT IN AERO
2.0000 | INHALATION_SPRAY | Freq: Two times a day (BID) | RESPIRATORY_TRACT | Status: DC
  Administered 2013-12-14 – 2013-12-18 (×4): 2 via RESPIRATORY_TRACT
  Filled 2013-12-14: qty 6

## 2013-12-14 MED ORDER — ALBUTEROL SULFATE (2.5 MG/3ML) 0.083% IN NEBU: 3.0000 mL | INHALATION_SOLUTION | Freq: Four times a day (QID) | RESPIRATORY_TRACT | Status: DC | PRN

## 2013-12-14 MED ORDER — MIDAZOLAM HCL 5 MG/ML IJ SOLN
INTRAMUSCULAR | Status: AC
  Filled 2013-12-14: qty 2

## 2013-12-14 MED ORDER — FENTANYL CITRATE 0.05 MG/ML IJ SOLN
INTRAMUSCULAR | Status: AC
  Filled 2013-12-14: qty 2

## 2013-12-14 MED ORDER — MIDAZOLAM HCL 10 MG/2ML IJ SOLN
INTRAMUSCULAR | Status: DC | PRN
  Administered 2013-12-14 (×3): 2 mg via INTRAVENOUS

## 2013-12-14 NOTE — Consult Note (Signed)
Subjective:   HPI  The patient is a 63 year old male who states that he began to experience black stools yesterday. Shortly after that he noticed some red blood in the stool but it all started off black in color. He has been having indigestion and a lot of belching for the past month. Last week he took some Aleve. He denies hematemesis. He has never had an ulcer to his knowledge. He had a little dizziness when this all started yesterday but currently feels fine. He denies chest pain or shortness of breath. Reviewing his labs shows that there has been some drop in hemoglobin.  He has a history of diverticulitis which according to him perforated in the past. He required surgery with temporary colostomy which was later taken down.  Review of Systems Denies chest pain or shortness of breath  Past Medical History  Diagnosis Date  . Arthritis   . MRSA (methicillin resistant Staphylococcus aureus)   . Cellulitis of abdominal wall   . Walking pneumonia 06/29/2009  . Mass     recurrent chronically infected masses   . Diverticulitis   . Coronary artery disease   . COPD (chronic obstructive pulmonary disease)   . Smoking hx   . S/P CABG x 5   . Hypertension   . H1N1 influenza   . Pneumonia    Past Surgical History  Procedure Laterality Date  . Coronary artery bypass graft    . 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 Bowel resection  may 2010  . Colectomy      sigmoid colectomy  . Colostomy    . Colostomy takedown    . Hernia repair      ventral hernia    History   Social History  . Marital Status: Married    Spouse Name: N/A    Number of Children: N/A  . Years of Education: N/A   Occupational History  . Not on file.   Social History Main Topics  . Smoking status: Former Smoker -- 2.00 packs/day for 38 years    Types: Cigarettes    Quit date: 04/22/2002  . Smokeless tobacco: Never Used  . Alcohol Use: No  . Drug Use: No  . Sexual Activity: Not on file   Other Topics  Concern  . Not on file   Social History Narrative   family history includes Allergies in his maternal grandmother and mother; Emphysema in his father. Current facility-administered medications: 0.9 %  sodium chloride infusion, , Intravenous, STAT, Lorretta HarpXilin Niu, MD, Last Rate: 75 mL/hr at 12/14/13 0255;  albuterol (PROVENTIL) (2.5 MG/3ML) 0.083% nebulizer solution 3 mL, 3 mL, Inhalation, Q6H PRN, Lorretta HarpXilin Niu, MD;  budesonide-formoterol [facility](SYMBICORT) 160-4.5 MCG/ACT inhaler 2 puff, 2 puff, Inhalation, BID, Lorretta HarpXilin Niu, MD, 2 puff at 12/14/13 1013 fentaNYL (SUBLIMAZE) injection 50 mcg, 50 mcg, Intravenous, Once, Emily West, PA-C, Stopped at 12/14/13 0010;  insulin aspart (novoLOG) injection 0-9 Units, 0-9 Units, Subcutaneous, TID WC, Lorretta HarpXilin Niu, MD, 2 Units at 12/14/13 [phone];  metoprolol succinate (TOPROL-XL) 24 hr tablet 100 mg, 100 mg, Oral, Daily, Lorretta HarpXilin Niu, MD, 100 mg at 12/14/13 0904;  ondansetron [facility](ZOFRAN) injection 4 mg, 4 mg, Intravenous, Once, Emily West, PA-C, Stopped at 12/14/13 0009 ondansetron (ZOFRAN) tablet 4 mg, 4 mg, Oral, Q6H PRN **OR** ondansetron (ZOFRAN) injection 4 mg, 4 mg, Intravenous, Q6H PRN, Lorretta HarpXilin Niu, MD;  pantoprazole (PROTONIX) injection 40 mg, 40 mg, Intravenous, Q12H, Lorretta HarpXilin Niu, MD, 40 mg at 12/14/13 0903;  rosuvastatin (CRESTOR) tablet 5 mg, 5  mg, Oral, QHS, Lorretta HarpXilin Niu, MD Allergies  Allergen Reactions  . Doxycycline     Skin rash (family not sure that this is a true reaction. Pt was in the sun when he had the reaction)  . Morphine And Related     Hallucinations   . Penicillins     Childhood reaction     Objective:     BP 117/57 mmHg  Pulse 86  Temp(Src) 98.2 F (36.8 C) (Oral)  Resp 17  Ht 5\' 10"  (1.778 m)  Wt 97.569 kg (215 lb 1.6 oz)  BMI 30.86 kg/m2  SpO2 95%  He is alert and oriented and in no acute distress  Heart regular rhythm no murmurs  Lungs clear  Abdomen: Bowel sounds present, soft, nontender, surgical scars present, no  hepatosplenomegaly  Laboratory No components found for: D1    Assessment:     Gastrointestinal bleeding. Given the presentation of melena initially I suspect this is an upper GI bleed.      Plan:     EGD to further evaluate. Continue PPI therapy Lab Results  Component Value Date   HGB 9.7* 12/14/2013   HGB 10.0* 12/14/2013   HGB 13.3 12/13/2013   HCT 29.6* 12/14/2013   HCT 30.5* 12/14/2013   HCT 39.0 12/13/2013   ALKPHOS 39 12/14/2013   ALKPHOS 52 12/24/2010   ALKPHOS 87 06/02/2008   AST 11 12/14/2013   AST 38* 12/24/2010   AST 28 06/02/2008   ALT 14 12/14/2013   ALT 27 12/24/2010   ALT 55* 06/02/2008

## 2013-12-14 NOTE — Plan of Care (Signed)
Problem: Phase I Progression Outcomes Goal: Pain controlled with appropriate interventions Outcome: Completed/Met Date Met:  12/14/13     

## 2013-12-14 NOTE — Op Note (Signed)
[facility] [address] [place], [id number]   ENDOSCOPY PROCEDURE REPORT  PATIENT: Daniel Norton, [name]  MR#: [id number] BIRTHDATE: 1950/08/01 , 63  yrs. old GENDER: male ENDOSCOPIST:Sam Evette CristalGanem, MD REFERRED BY: PROCEDURE DATE:  12/14/2013 PROCEDURE:   EGD ASA CLASS:    2 INDICATIONS: melena MEDICATION: fentanyl 50 g IV, Versed 6 mg IV TOPICAL ANESTHETIC:   Cetacaine spray  DESCRIPTION OF PROCEDURE:   After the risks and benefits of the procedure were explained, informed consent was obtained.  The Pentax Gastroscope [id number]  endoscope was introduced through the mouth  and advanced to the second portion of the duodenum .  The instrument was slowly withdrawn as the mucosa was fully examined.  Findings:  Esophagus: Normal  Stomach: Normal  Duodenum: Normal with a duodenal diverticulum present in the second portion of the duodenum with food in it.  No evidence of active bleeding or stigmata of bleeding seen on this examination    The scope was then withdrawn from the patient and the procedure completed.  COMPLICATIONS: There were no immediate complications.  ENDOSCOPIC IMPRESSION: normal EGD with a duodenal diverticulum present. No evidence of active bleeding or stigmata of bleeding.  RECOMMENDATIONS:continue supportive care. Watch for further evidence of bleeding. Colonoscopy to evaluate colon for a source of bleeding    _______________________________ eSignedWandalee Ferdinand:  Sam Koven Belinsky, MD 12/14/2013 1:41 PM     cc:  CPT CODES: ICD CODES:  The ICD and CPT codes recommended by this software are interpretations from the data that the clinical staff has captured with the software.  The verification of the translation of this report to the ICD and CPT codes and modifiers is the sole responsibility of the health care institution and practicing physician where this report was generated.  [employer] will not be held responsible for  the validity of the ICD and CPT codes included on this report.  AMA assumes no liability for data contained or not contained herein. CPT is a Publishing rights managerregistered trademark of the [employer].  PATIENT NAME:  Daniel Norton, Keene T MR#: [id number]

## 2013-12-14 NOTE — Progress Notes (Signed)
[facility]  Brief Update Note  S: [name] reports no further blood and that he is feeling ok this morning  O: BP 108/66 mmHg  Pulse 74  Temp(Src) 97.8 F (36.6 C) (Oral)  Resp 16  Ht 5\' 10"  (1.778 m)  Wt 215 lb 1.6 oz (97.569 kg)  BMI 30.86 kg/m2  SpO2 98%  Gen: Appears well, no acute distress CVS: RR, NR, no murmur noted Pulm: CTAB, no wheeze Abd: Soft, NT, ND, +BS Ext: thin, warm and dry Neuro: Grossly intact  Assessment: [name] is a 63yo man with h/o diverticulitis s/p surgical correction who presented with melena concerning for upper GI bleeding  Plan:  - Monitor CBC q6 hours today - GI Consult pending - Blood transfusion for Hgb < 7 - Holding BP medications, restart in a stepwise fashion if BP elevated - IVF with NS 75cc/hr - NPO  Other issues noted in Dr. Evorn GongNiu's H&P  Debe CoderMULLEN, EMILY, MD

## 2013-12-14 NOTE — H&P (Addendum)
[facility] History and Physical  Daniel SorensonFrank T Norton ZOX:[id number] DOB: 10/26/1950 DOA: 12/13/2013  Referring physician: ED physician PCP: Daniel HectorNYLAND,LEONARD ROBERT, MD  Specialists:   Chief Complaint: Rectal bleeding  HPI: Daniel SorensonFrank T Letson is a 63 y.o. male with past medical history of coronary artery disease, s/p of CABG, s/p of colectomy, COPD, hypertension, diabetes mellitus, diastolic congestive heart failure, chronic left leg edema, hyperlipidemia, who presents with rectal bleeding and dark stool.  Patient reports that he noticed black stool yesterday morning and bright red blood in stool in evening. He has mild nausea, but no vomiting. He has mild chronic diarrhea after colectomy, which has not changed. He had one episode of dizziness early, but no dizziness currently. He has mild acid reflux symptoms, but no history of peptic ulcer. Patient has been taking Aleve intermittently for abdominal pain. In the past 3 days, he has been taking 1 tablet every day. Patient had 2 episode of the rectal bleeding after arrival to ED. Currently patient does not have chest pain, shortness of breath. He denies fever, chills, chest pain, SOB,dysuria, urgency, frequency, hematuria, skin rashes.   Work up in the ED demonstrates Hgb drop from 14.7 on 12/27/10 to 12.1 today. BUN/Cre 34/0.90. Patient is admitted to inpatient for further evaluation and treatment. GI was consulted.  Review of Systems: As presented in the history of presenting illness, rest negative.  Where does patient live?  At home Can patient participate in ADLs? Yes  Allergy:  Allergies  Allergen Reactions  . Doxycycline     Skin rash (family not sure that this is a true reaction. Pt was in the sun when he had the reaction)  . Morphine And Related     Hallucinations   . Penicillins     Childhood reaction    Past Medical History  Diagnosis Date  . Arthritis   . MRSA (methicillin resistant Staphylococcus aureus)   . Cellulitis of  abdominal wall   . Walking pneumonia 06/29/2009  . Mass     recurrent chronically infected masses   . Diverticulitis   . Coronary artery disease   . COPD (chronic obstructive pulmonary disease)   . Smoking hx   . S/P CABG x 5   . Hypertension   . H1N1 influenza   . Pneumonia     Past Surgical History  Procedure Laterality Date  . Coronary artery bypass graft    . 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 Bowel resection  may 2010  . Colectomy      sigmoid colectomy  . Colostomy    . Colostomy takedown    . Hernia repair      ventral hernia     Social History:  reports that he quit smoking about 11 years ago. His smoking use included Cigarettes. He has a 76 pack-year smoking history. He has never used smokeless tobacco. He reports that he does not drink alcohol or use illicit drugs.  Family History:  Family History  Problem Relation Age of Onset  . Emphysema Father   . Allergies Mother   . Allergies Maternal Grandmother      Prior to Admission medications   Medication Sig Start Date End Date Taking? Authorizing Provider  albuterol (PROVENTIL HFA;VENTOLIN HFA) 108 (90 BASE) MCG/ACT inhaler Inhale 2 puffs into the lungs every 6 (six) hours as needed for wheezing or shortness of breath. 04/21/13  Yes Lupita Leashouglas B McQuaid, MD  amLODipine (NORVASC) 5 MG tablet Take 5 mg by mouth daily.  09/12/10  Yes Historical Provider, MD  budesonide-formoterol (SYMBICORT) 160-4.5 MCG/ACT inhaler Inhale 2 puffs into the lungs 2 (two) times daily.    Yes Historical Provider, MD  CRESTOR 5 MG tablet Take 5 mg by mouth at bedtime.  09/12/10  Yes Historical Provider, MD  furosemide (LASIX) 20 MG tablet Take 20 mg by mouth. Takes MWF   Yes Historical Provider, MD  lisinopril (PRINIVIL,ZESTRIL) 10 MG tablet Take 20 mg by mouth every evening.    Yes Historical Provider, MD  metFORMIN (GLUCOPHAGE) 500 MG tablet Take 500-1,000 mg by mouth 2 (two) times daily. 500mg  in the morning and 1000mg  in the evening    Yes Historical Provider, MD  metoprolol (TOPROL-XL) 100 MG 24 hr tablet Take 100 mg by mouth every morning.  09/02/10  Yes Historical Provider, MD  Omega-3 Fatty Acids (FISH OIL) 1200 MG CAPS Take 2 capsules by mouth daily.    Yes Historical Provider, MD  Spacer/Aero-Holding Chambers (AEROCHAMBER MV) inhaler Use as instructed 04/21/13  Yes Lupita Leash, MD  mometasone-formoterol (DULERA) 100-5 MCG/ACT AERO Inhale 2 puffs into the lungs 2 (two) times daily. Patient not taking: Reported on 12/13/2013 04/22/13   Lupita Leash, MD    Physical Exam: Filed Vitals:   12/13/13 2111 12/13/13 2331 12/14/13 0015 12/14/13 0045  BP: 125/76 103/58 103/59 108/66  Pulse: 96 90 93 84  Temp: 97.8 F (36.6 C)     TempSrc: Oral     Resp: 20 16 18 22   Height: 5\' 10"  (1.778 m)     Weight: 99.791 kg (220 lb)     SpO2: 95% 94% 98% 98%   General: Not in acute distress HEENT:       Eyes: PERRL, EOMI, no scleral icterus       ENT: No discharge from the ears and nose, no pharynx injection, no tonsillar enlargement.        Neck: No JVD, no bruit, no mass felt. Cardiac: S1/S2, RRR, No murmurs, No gallops or rubs Pulm: Good air movement bilaterally. Clear to auscultation bilaterally. No rales, wheezing, rhonchi or rubs. Abd: Soft, distended, there is mild tenderness over lower abdomen.  There is no rebound and no guarding. Multiple well healed large surgical scars. BS present. ED did rectal exam, found internal hemorrhoid and gross dark blood on glove  Ext: No edema bilaterally. 2+DP/PT pulse bilaterally Musculoskeletal: No joint deformities, erythema, or stiffness, ROM full Skin: No rashes.  Neuro: Alert and oriented X3, cranial nerves II-XII grossly intact, muscle strength 5/5 in all extremeties, sensation to light touch intact. Moves all extremities. Psych: Patient is not psychotic, no suicidal or hemocidal ideation.  Labs on Admission:  Basic Metabolic Panel:  Recent Labs Lab 12/13/13 2132  NA 142   K 4.2  CL 101  GLUCOSE 153*  BUN 34*  CREATININE 0.90   Liver Function Tests: No results for input(s): AST, ALT, ALKPHOS, BILITOT, PROT, ALBUMIN in the last 168 hours. No results for input(s): LIPASE, AMYLASE in the last 168 hours. No results for input(s): AMMONIA in the last 168 hours. CBC:  Recent Labs Lab 12/13/13 2115 12/13/13 2132  WBC 12.3*  --   NEUTROABS 7.8*  --   HGB 12.1* 13.3  HCT 36.9* 39.0  MCV 88.9  --   PLT 327  --    Cardiac Enzymes: No results for input(s): CKTOTAL, CKMB, CKMBINDEX, TROPONINI in the last 168 hours.  BNP (last 3 results) No results for input(s): PROBNP in the last 8760  hours. CBG: No results for input(s): GLUCAP in the last 168 hours.  Radiological Exams on Admission: Ct Abdomen Pelvis W Contrast  12/14/2013   CLINICAL DATA:  Acute onset of rectal bleeding and bilateral lower quadrant abdominal pain. Diarrhea. Initial encounter.  EXAM: CT ABDOMEN AND PELVIS WITH CONTRAST  TECHNIQUE: Multidetector CT imaging of the abdomen and pelvis was performed using the standard protocol following bolus administration of intravenous contrast.  CONTRAST:  100mL OMNIPAQUE IOHEXOL 300 MG/ML  SOLN  COMPARISON:  CT of the abdomen and pelvis performed 11/04/2008  FINDINGS: The visualized lung bases are clear. Diffuse coronary artery calcification is seen.  The liver and spleen are unremarkable in appearance. The gallbladder is within normal limits. A relatively stable 1.5 cm adrenal adenoma is noted arising at the left adrenal gland. The right adrenal gland is unremarkable in appearance. The pancreas is within normal limits. Incidental note is made of a duodenal diverticulum at the head of the pancreas.  Nonspecific perinephric stranding is noted bilaterally. Scattered small bilateral renal cysts are seen. A prominent column of Bertin is noted on the right side. The kidneys are otherwise grossly unremarkable. There is no evidence of hydronephrosis. No renal or  ureteral stones are seen.  No free fluid is identified. The small bowel is unremarkable in appearance. The stomach is within normal limits. No acute vascular abnormalities are seen. Mild scattered calcification is seen along the abdominal aorta and its branches.  The appendix is normal in caliber and contains air, without evidence for appendicitis. Mild diverticulosis is noted along the distal descending and proximal sigmoid colon, without evidence of diverticulitis. A few tiny foci of increased attenuation at the proximal sigmoid colon are grossly stable from 2010 and may reflect chronic calcification. The colon is otherwise unremarkable.  The bladder is decompressed and not well assessed. The prostate remains normal in size, with scattered calcification. No inguinal lymphadenopathy is seen.  No acute osseous abnormalities are identified. Vacuum phenomenon is noted at L5-S1. Facet disease is noted at the lower lumbar spine.  IMPRESSION: 1. Mild diverticulosis along the distal descending and proximal sigmoid colon, without evidence of diverticulitis. No focal source of bleeding is identified on CT. 2. Diffuse coronary artery calcification seen. 3. Stable 1.5 cm adrenal adenoma at the left adrenal gland. 4. 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 5. Scattered small bilateral renal cysts seen. 6. Mild scattered calcification along the abdominal aorta and its branches.   Electronically Signed   By: Roanna RaiderJeffery  Chang M.D.   On: 12/14/2013 00:20    EKG: Independently reviewed.   Assessment/Plan Principal Problem:   Rectal bleeding Active Problems:   S/P CABG x 5   Smoking hx   COPD (chronic obstructive pulmonary disease)   Coronary artery disease   Arthritis   Diabetes mellitus without complication  Rectal bleeding: Patient's presentations is consistent with upper GI bleeding given dark stool and increased BUN/creatinine ratio. It is likely secondary to Aleve use. 3  episodes of the bright red blood per rectal are likely due to hemorrhoids, but also possible from diverticulosis. Currently patient is hemodynamically stable. His hemoglobin dropped by 2.6 compatible with 2 years ago. It is not sure whether this is acute drop of hemoglobin. Patient does not have tachycardia, but the patient is beta blocker. -will admit to Med-surg bed - avoid NSAIDs - type and screen - cbc q6h, transfuse prn - Protonix 40 mg bid - Hold Lasix, amlodipine and Lisinopril - IVF: ns  75 cc/h - GI consulted, Dr. Ramon Dredge will see in AM - NPO   CAD: s/p of CABG. No chest pain -check EKG -continue metoprolol and Crestor  COPD: Stable, no signs of acute exacerbation. Auscultation is clear bilaterally. -Continue home inhalers  Diabetes mellitus: Last A1c 6.3 on 12/27/10 -DC home metformin -Sliding-scale insulin -Check A1c  HTN:  -Hold Lasix and lisinopril, amlodipine -Continue metoprolol   DVT ppx: SCD  Code Status: Full code Family Communication: Yes, patient's family   at bed side Disposition Plan: Admit to inpatient   Date of Service 12/14/2013    Lorretta Harp [facility] Pager [phone]  If 7PM-7AM, please contact night-coverage [email or web address] Password [email or web address] 12/14/2013, 1:21 AM

## 2013-12-15 ENCOUNTER — Encounter (HOSPITAL_COMMUNITY)
Admission: EM | Disposition: A | Discharge status: Home / Self Care | Payer: Self-pay | Source: Home / Self Care | Attending: Family Medicine

## 2013-12-15 ENCOUNTER — Inpatient Hospital Stay (HOSPITAL_COMMUNITY): Payer: BC Managed Care – PPO | Admitting: Anesthesiology

## 2013-12-15 ENCOUNTER — Encounter (HOSPITAL_COMMUNITY): Payer: Self-pay

## 2013-12-15 DIAGNOSIS — D62 Acute posthemorrhagic anemia: Secondary | ICD-10-CM

## 2013-12-15 HISTORY — PX: COLONOSCOPY: SHX5424

## 2013-12-15 LAB — CBC
HCT: 26.2 % — ABNORMAL LOW (ref 39.0–52.0)
HEMATOCRIT: 26.8 % — AB (ref 39.0–52.0)
HEMOGLOBIN: 8.7 g/dL — AB (ref 13.0–17.0)
Hemoglobin: 8.6 g/dL — ABNORMAL LOW (ref 13.0–17.0)
MCH: 28.6 pg (ref 26.0–34.0)
MCH: 30.2 pg (ref 26.0–34.0)
MCHC: 32.1 g/dL (ref 30.0–36.0)
MCHC: 33.2 g/dL (ref 30.0–36.0)
MCV: 89 fL (ref 78.0–100.0)
MCV: 91 fL (ref 78.0–100.0)
Platelets: 265 10*3/uL (ref 150–400)
Platelets: 264 10*3/uL (ref 150–400)
RBC: 3.01 MIL/uL — ABNORMAL LOW (ref 4.22–5.81)
RBC: 2.88 MIL/uL — AB (ref 4.22–5.81)
RDW: 13.6 % (ref 11.5–15.5)
RDW: 13.6 % (ref 11.5–15.5)
WBC: 10.5 10*3/uL (ref 4.0–10.5)
WBC: 8.6 10*3/uL (ref 4.0–10.5)

## 2013-12-15 LAB — BASIC METABOLIC PANEL
Anion gap: 8 (ref 5–15)
BUN: 20 mg/dL (ref 6–23)
CHLORIDE: 105 meq/L (ref 96–112)
CO2: 27 mEq/L (ref 19–32)
CREATININE: 0.77 mg/dL (ref 0.50–1.35)
Calcium: 7.9 mg/dL — ABNORMAL LOW (ref 8.4–10.5)
GFR calc Af Amer: >90 mL/min (ref >90)
GFR calc non Af Amer: >90 mL/min (ref >90)
GLUCOSE: 121 mg/dL — AB (ref 70–99)
POTASSIUM: 4.3 meq/L (ref 3.7–5.3)
Sodium: 140 mEq/L (ref 137–147)

## 2013-12-15 LAB — GLUCOSE, CAPILLARY
GLUCOSE-CAPILLARY: 175 mg/dL — AB (ref 70–99)
GLUCOSE-CAPILLARY: 150 mg/dL — AB (ref 70–99)
Glucose-Capillary: 123 mg/dL — ABNORMAL HIGH (ref 70–99)
Glucose-Capillary: 182 mg/dL — ABNORMAL HIGH (ref 70–99)

## 2013-12-15 LAB — HEMOGLOBIN AND HEMATOCRIT, BLOOD
HCT: 18.8 % — ABNORMAL LOW (ref 39.0–52.0)
Hemoglobin: 6.4 g/dL — CL (ref 13.0–17.0)

## 2013-12-15 LAB — PREPARE RBC (CROSSMATCH)

## 2013-12-15 SURGERY — COLONOSCOPY
Anesthesia: Monitor Anesthesia Care

## 2013-12-15 MED ORDER — LIDOCAINE HCL (CARDIAC) 20 MG/ML IV SOLN
INTRAVENOUS | Status: DC | PRN
  Administered 2013-12-15: 60 mg via INTRAVENOUS

## 2013-12-15 MED ORDER — LACTATED RINGERS IV SOLN
INTRAVENOUS | Status: DC | PRN
  Administered 2013-12-15: 08:00:00 via INTRAVENOUS

## 2013-12-15 MED ORDER — PROPOFOL INFUSION 10 MG/ML OPTIME
INTRAVENOUS | Status: DC | PRN
  Administered 2013-12-15: 100 ug/kg/min via INTRAVENOUS

## 2013-12-15 MED ORDER — SODIUM CHLORIDE 0.9 % IV SOLN: Freq: Once | INTRAVENOUS | Status: DC

## 2013-12-15 NOTE — Progress Notes (Signed)
[facility] PROGRESS NOTE  Daniel Norton JYN:[id number] DOB: 04/29/1950 DOA: 12/13/2013 PCP: Josue HectorNYLAND,LEONARD ROBERT, MD  Assessment/Plan: 63 y.o. male with past medical history of coronary artery disease, s/p of CABG, s/p of colectomy, COPD, hypertension, diabetes mellitus, diastolic congestive heart failure, chronic left leg edema, hyperlipidemia, who presents with rectal bleeding and dark stool.  1. GIB; no s/s of active bleeding  -12/2: s/p EGD/colon: diverticulosis. Old blood present in the colon -cont close monitor, Hg; hemodynamically stable; GI following; d/ced NSAIDS   2. Acute blood loss anemia; no s/s further bleeding; Hg 12.1 on admit; currently stable around 8.6;   -monitor Hg, TF prn;  3. CAD s/p CABG; denies acute cardiopulmonary symptoms; not on antiplatelets  4. HTN, cont home regimen  5. DM, HA1c-7.7; cont ISS:   Code Status: full Family Communication: d/w patient, his wife (indicate person spoken with, relationship, and if by phone, the number) Disposition Plan: home 24-48 hrs if stable    Consultants:  GI  Procedures:  As above   Antibiotics:  None  (indicate start date, and stop date if known)  HPI/Subjective: Alert, denies any pains  Objective: Filed Vitals:   12/15/13 0954  BP: 138/71  Pulse: 99  Temp: 97.9 F (36.6 C)  Resp: 17    Intake/Output Summary (Last 24 hours) at 12/15/13 1142 Last data filed at 12/15/13 0917  Gross per 24 hour  Intake   2533 ml  Output    700 ml  Net   1833 ml   Filed Weights   12/14/13 0515 12/15/13 0521 12/15/13 0733  Weight: 97.569 kg (215 lb 1.6 oz) 103.465 kg (228 lb 1.6 oz) 103.42 kg (228 lb)    Exam:   General:  alert  Cardiovascular: s1,s2 rrr  Respiratory: CTA BL  Abdomen: soft, nt,nd   Musculoskeletal: no LE edema    Data Reviewed: Basic Metabolic Panel:  Recent Labs Lab 12/13/13 2132 12/14/13 0920 12/15/13 0525  NA 142 142 140  K 4.2 4.2 4.3  CL 101 107 105  CO2  --   27 27  GLUCOSE 153* 142* 121*  BUN 34* 32* 20  CREATININE 0.90 0.76 0.77  CALCIUM  --  8.0* 7.9*   Liver Function Tests:  Recent Labs Lab 12/14/13 0920  AST 11  ALT 14  ALKPHOS 39  BILITOT <0.2*  PROT 5.5*  ALBUMIN 2.7*   No results for input(s): LIPASE, AMYLASE in the last 168 hours. No results for input(s): AMMONIA in the last 168 hours. CBC:  Recent Labs Lab 12/13/13 2115  12/14/13 0920 12/14/13 1720 12/14/13 2114 12/15/13 0525 12/15/13 1040  WBC 12.3*  < > 10.1 10.1 11.9* 10.5 8.6  NEUTROABS 7.8*  --   --   --   --   --   --   HGB 12.1*  < > 9.7* 9.8* 8.9* 8.6* 8.7*  HCT 36.9*  < > 29.6* 30.5* 27.5* 26.8* 26.2*  MCV 88.9  < > 88.6 88.9 88.4 89.0 91.0  PLT 327  < > 266 258 263 265 264  < > = values in this interval not displayed. Cardiac Enzymes: No results for input(s): CKTOTAL, CKMB, CKMBINDEX, TROPONINI in the last 168 hours. BNP (last 3 results) No results for input(s): PROBNP in the last 8760 hours. CBG:  Recent Labs Lab 12/14/13 0754 12/14/13 1641 12/15/13 0949  GLUCAP 182* 173* 123*    No results found for this or any previous visit (from the past 240 hour(s)).   Studies:  Ct Abdomen Pelvis W Contrast  12/14/2013   CLINICAL DATA:  Acute onset of rectal bleeding and bilateral lower quadrant abdominal pain. Diarrhea. Initial encounter.  EXAM: CT ABDOMEN AND PELVIS WITH CONTRAST  TECHNIQUE: Multidetector CT imaging of the abdomen and pelvis was performed using the standard protocol following bolus administration of intravenous contrast.  CONTRAST:  100mL OMNIPAQUE IOHEXOL 300 MG/ML  SOLN  COMPARISON:  CT of the abdomen and pelvis performed 11/04/2008  FINDINGS: The visualized lung bases are clear. Diffuse coronary artery calcification is seen.  The liver and spleen are unremarkable in appearance. The gallbladder is within normal limits. A relatively stable 1.5 cm adrenal adenoma is noted arising at the left adrenal gland. The right adrenal gland is  unremarkable in appearance. The pancreas is within normal limits. Incidental note is made of a duodenal diverticulum at the head of the pancreas.  Nonspecific perinephric stranding is noted bilaterally. Scattered small bilateral renal cysts are seen. A prominent column of Bertin is noted on the right side. The kidneys are otherwise grossly unremarkable. There is no evidence of hydronephrosis. No renal or ureteral stones are seen.  No free fluid is identified. The small bowel is unremarkable in appearance. The stomach is within normal limits. No acute vascular abnormalities are seen. Mild scattered calcification is seen along the abdominal aorta and its branches.  The appendix is normal in caliber and contains air, without evidence for appendicitis. Mild diverticulosis is noted along the distal descending and proximal sigmoid colon, without evidence of diverticulitis. A few tiny foci of increased attenuation at the proximal sigmoid colon are grossly stable from 2010 and may reflect chronic calcification. The colon is otherwise unremarkable.  The bladder is decompressed and not well assessed. The prostate remains normal in size, with scattered calcification. No inguinal lymphadenopathy is seen.  No acute osseous abnormalities are identified. Vacuum phenomenon is noted at L5-S1. Facet disease is noted at the lower lumbar spine.  IMPRESSION: 1. Mild diverticulosis along the distal descending and proximal sigmoid colon, without evidence of diverticulitis. No focal source of bleeding is identified on CT. 2. Diffuse coronary artery calcification seen. 3. Stable 1.5 cm adrenal adenoma at the left adrenal gland. 4. 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 5. Scattered small bilateral renal cysts seen. 6. Mild scattered calcification along the abdominal aorta and its branches.   Electronically Signed   By: Roanna RaiderJeffery  Chang M.D.   On: 12/14/2013 00:20    Scheduled Meds: .  budesonide-formoterol  2 puff Inhalation BID  . fentaNYL  50 mcg Intravenous Once  . insulin aspart  0-9 Units Subcutaneous TID WC  . metoprolol succinate  100 mg Oral Daily  . ondansetron (ZOFRAN) IV  4 mg Intravenous Once  . pantoprazole (PROTONIX) IV  40 mg Intravenous Q12H  . rosuvastatin  5 mg Oral QHS   Continuous Infusions: . sodium chloride 75 mL/hr at 12/15/13 [id number]    Principal Problem:   Rectal bleeding Active Problems:   S/P CABG x 5   Smoking hx   COPD (chronic obstructive pulmonary disease)   Coronary artery disease   Arthritis   Diabetes mellitus without complication    Time spent: >35 minutes     Esperanza SheetsBURIEV, Sasuke Yaffe N  [facility] Pager [phone]. If 7PM-7AM, please contact night-coverage at [email or web address], password [email or web address] 12/15/2013, 11:42 AM  LOS: 2 days

## 2013-12-15 NOTE — Progress Notes (Addendum)
Shift events: RN paged this NP earlier due to resulted Hgb of 6.4 down from 8.6. Also, pt up to bathroom and had dark red stool. He also feels weak and dizzy at present. He is in NAD. VS are stable at 123/52, HR 100, and O2 sat 95%.  Per pt, he says he has had about 7 bloody stools today but this is not charted. GI is following. Pt had a normal EGD today and a colonoscopy which showed no active bleed, just old blood in the colon. Per GI note, if pt bleeds again, a nuclear tag bleeding study is next step, so will order that for am.  For now, will give pt 3 Units PRBCs and cycle H/Hs. INR on 12/1 was 1.10. Pt has a echocardiogram on chart from 2012 which showed grade I diastolic dysfunction with normal EF. Will monitor for fluid overload and use Lasix if needed.  Jimmye NormanKaren Kirby-Graham, NP [facility] Update: Hgb up to 9.8 s/p transfusion. This NP has not been notified of any further bleeding tonight. KJKG, NP

## 2013-12-15 NOTE — Op Note (Signed)
[facility] [address] [place], [id number]   COLONOSCOPY PROCEDURE REPORT     EXAM DATE: 12/15/2013  PATIENT NAME:      Laverda SorensonCechini, Daniel Norton           MR #:      [id number]  BIRTHDATE:       16-Apr-1950      VISIT #:     [id number]  ATTENDING:     Wandalee FerdinandSam Bernese Doffing, MD     STATUS:     inpatient REFERRING MD: ASA CLASS:        2  INDICATIONS:  The patient is a 63 yr old male here for a colonoscopy due to gastrointestinal bleeding. Negative EGD. PROCEDURE PERFORMED:     colonoscopy MEDICATIONS:     propofol per anesthesia ESTIMATED BLOOD LOSS:     None  CONSENT: The patient understands the risks and benefits of the procedure and understands that these risks include, but are not limited to: sedation, allergic reaction, infection, perforation and/or bleeding. Alternative means of evaluation and treatment include, among others: physical exam, x-rays, and/or surgical intervention. The patient elects to proceed with this endoscopic procedure.  DESCRIPTION OF PROCEDURE: During intra-op preparation period all mechanical & medical equipment was checked for proper function. Hand hygiene and appropriate measures for infection prevention was taken. After the risks, benefits and alternatives of the procedure were thoroughly explained, Informed consent was verified, confirmed and timeout was successfully executed by the treatment team. A digital exam was normal.           The [employer] Adult Colon [id number] endoscope was introduced through the anus and advanced to the terminal ileum     . No adverse events experienced. The prep was adequate     . The instrument was then slowly withdrawn as the colon was fully examined. The scope was then completely withdrawn from the patient and the procedure terminated. WITHDRAWAL TIME:  Findings:  The terminal ileum was normal. There was no evidence of blood in the terminal ileum.  The colon was examined then from the cecum to  the rectum. Cecum is imaged in image 002. Rectum imaged in image [id number] with retroflexed view. There were a few scattered diverticula noted in the colon which were very small. There was a scattered amount of old-appearing blood in the colon, but no evidence of fresh bleeding or stigmata of bleeding noted.    ADVERSE EVENTS:      There were no immediate complications.  IMPRESSIONS:     diverticulosis. Old blood present in the colon. No sign of active bleeding or stigmata of bleeding.  RECOMMENDATIONS:     follow clinically. Advance diet. Watch for further signs of bleeding. If there are signs of active bleeding the next step would be to do a nuclear medicine GI bleeding scan. RECALL:  Wandalee FerdinandSam Maddyx Vallie, MD eSigned:  Wandalee FerdinandSam Germani Gavilanes, MD 12/15/2013 9:18 AM   cc:  CPT CODES: ICD CODES:  The ICD and CPT codes recommended by this software are interpretations from the data that the clinical staff has captured with the software.  The verification of the translation of this report to the ICD and CPT codes and modifiers is the sole responsibility of the health care institution and practicing physician where this report was generated.  [employer] will not be held responsible for the validity of the ICD and CPT codes included on this report.  AMA assumes no liability for data contained or not  contained herein. CPT is a Publishing rights managerregistered trademark of the [employer].

## 2013-12-15 NOTE — Anesthesia Postprocedure Evaluation (Signed)
Anesthesia Post Note  Patient: Daniel Norton  Procedure(s) Performed: Procedure(s) (LRB): COLONOSCOPY (N/A)  Anesthesia type: general  Patient location: PACU  Post pain: Pain level controlled  Post assessment: Patient's Cardiovascular Status Stable  Last Vitals:  Filed Vitals:   12/15/13 0954  BP: 138/71  Pulse: 99  Temp: 36.6 C  Resp: 17    Post vital signs: Reviewed and stable  Level of consciousness: sedated  Complications: No apparent anesthesia complications

## 2013-12-15 NOTE — Addendum Note (Signed)
Addendum  created 12/15/13 1120 by Arta BruceKevin Nattaly Yebra, MD   Modules edited: Anesthesia Responsible Staff

## 2013-12-15 NOTE — Anesthesia Preprocedure Evaluation (Addendum)
Anesthesia Evaluation  Patient identified by MRN, date of birth, ID band Patient awake    Reviewed: Allergy & Precautions, H&P , NPO status , Patient's Chart, lab work & pertinent test results  History of Anesthesia Complications (+) history of anesthetic complications  Airway Mallampati: II  TM Distance: >3 FB Neck ROM: Full    Dental  (+) Edentulous Upper, Edentulous Lower   Pulmonary COPD COPD inhaler, former smoker,  Patient aspirated post anesthesia 3 years ago post bowel surgery. + rhonchi   Pulmonary exam normal - wheezing      Cardiovascular hypertension, Pt. on medications + CAD Rhythm:Regular Rate:Normal  S/P MI and CABG 2003                      ej  fx  65-70         5   Neuro/Psych    GI/Hepatic GI bleeding   Endo/Other  diabetes, Type 2, Oral Hypoglycemic Agents  Renal/GU      Musculoskeletal   Abdominal   Peds  Hematology   Anesthesia Other Findings   Reproductive/Obstetrics                           Anesthesia Physical Anesthesia Plan  ASA: III  Anesthesia Plan: MAC   Post-op Pain Management:    Induction: Intravenous  Airway Management Planned: Mask  Additional Equipment:   Intra-op Plan:   Post-operative Plan:   Informed Consent: I have reviewed the patients History and Physical, chart, labs and discussed the procedure including the risks, benefits and alternatives for the proposed anesthesia with the patient or authorized representative who has indicated his/her understanding and acceptance.     Plan Discussed with: CRNA and Surgeon  Anesthesia Plan Comments:       Anesthesia Quick Evaluation

## 2013-12-15 NOTE — Transfer of Care (Signed)
Immediate Anesthesia Transfer of Care Note  Patient: Daniel Norton  Procedure(s) Performed: Procedure(s): COLONOSCOPY (N/A)  Patient Location: PACU and Endoscopy Unit  Anesthesia Type:MAC  Level of Consciousness: awake, alert  and oriented  Airway & Oxygen Therapy: Patient Spontanous Breathing and Patient connected to face mask oxygen  Post-op Assessment: Report given to PACU RN, Post -op Vital signs reviewed and stable and Patient moving all extremities  Post vital signs: Reviewed and stable  Complications: No apparent anesthesia complications

## 2013-12-16 ENCOUNTER — Inpatient Hospital Stay (HOSPITAL_COMMUNITY): Payer: BC Managed Care – PPO

## 2013-12-16 ENCOUNTER — Encounter (HOSPITAL_COMMUNITY): Payer: Self-pay | Admitting: Gastroenterology

## 2013-12-16 LAB — GLUCOSE, CAPILLARY
Glucose-Capillary: 128 mg/dL — ABNORMAL HIGH (ref 70–99)
Glucose-Capillary: 115 mg/dL — ABNORMAL HIGH (ref 70–99)
Glucose-Capillary: 132 mg/dL — ABNORMAL HIGH (ref 70–99)

## 2013-12-16 LAB — HEMOGLOBIN AND HEMATOCRIT, BLOOD
HCT: 29.6 % — ABNORMAL LOW (ref 39.0–52.0)
HCT: 30 % — ABNORMAL LOW (ref 39.0–52.0)
HCT: 26.7 % — ABNORMAL LOW (ref 39.0–52.0)
HEMOGLOBIN: 10.1 g/dL — AB (ref 13.0–17.0)
Hemoglobin: 9.8 g/dL — ABNORMAL LOW (ref 13.0–17.0)
Hemoglobin: 8.9 g/dL — ABNORMAL LOW (ref 13.0–17.0)

## 2013-12-16 MED ORDER — TECHNETIUM TC 99M-LABELED RED BLOOD CELLS IV KIT
25.0000 | PACK | Freq: Once | INTRAVENOUS | Status: AC | PRN
  Administered 2013-12-16: 25 via INTRAVENOUS

## 2013-12-16 MED ORDER — MIDAZOLAM HCL 2 MG/2ML IJ SOLN
INTRAMUSCULAR | Status: DC | PRN
  Administered 2013-12-16: 1 mg via INTRAVENOUS

## 2013-12-16 MED ORDER — LIDOCAINE HCL 1 % IJ SOLN
INTRAMUSCULAR | Status: AC
Start: 2013-12-16 — End: 2013-12-17
  Filled 2013-12-16: qty 20

## 2013-12-16 MED ORDER — DEXTROSE-NACL 5-0.9 % IV SOLN
INTRAVENOUS | Status: DC
  Administered 2013-12-16 – 2013-12-17 (×2): via INTRAVENOUS

## 2013-12-16 MED ORDER — FENTANYL CITRATE 0.05 MG/ML IJ SOLN
INTRAMUSCULAR | Status: AC
  Filled 2013-12-16: qty 2

## 2013-12-16 MED ORDER — MIDAZOLAM HCL 2 MG/2ML IJ SOLN
INTRAMUSCULAR | Status: AC
  Filled 2013-12-16: qty 2

## 2013-12-16 MED ORDER — FENTANYL CITRATE 0.05 MG/ML IJ SOLN
INTRAMUSCULAR | Status: DC | PRN
  Administered 2013-12-16: 50 ug via INTRAVENOUS

## 2013-12-16 MED ORDER — IOHEXOL 300 MG/ML  SOLN
100.0000 mL | Freq: Once | INTRAMUSCULAR | Status: AC | PRN
  Administered 2013-12-16: 100 mL via INTRAVENOUS

## 2013-12-16 NOTE — Progress Notes (Signed)
[facility] Progress Note  Subjective: The patient experience further gastrointestinal bleeding last night. He describes dark reddish stools. He had a drop in hemoglobin and hematocrit and was transfused. He feels better this morning. EGD and colonoscopy have not revealed the site of bleeding. A nuclear medicine GI bleeding scan is pending.  Objective: Vital signs in last 24 hours: Temp:  [98.4 F (36.9 C)-99 F (37.2 C)] 98.4 F (36.9 C) (12/03 0457) Pulse Rate:  [84-100] 84 (12/03 0457) Resp:  [16-20] 20 (12/03 0457) BP: (110-143)/(52-66) 135/65 mmHg (12/03 0457) SpO2:  [93 %-97 %] 95 % (12/03 0457) Weight change: -0.045 kg (-1.6 oz)   PE  No acute distress  Heart regular rhythm  Abdomen soft and nontender  Lab Results: Results for orders placed or performed during the hospital encounter of 12/13/13 (from the past 24 hour(s))  CBC     Status: Abnormal   Collection Time: 12/15/13 10:40 AM  Result Value Ref Range   WBC 8.6 4.0 - 10.5 K/uL   RBC 2.88 (L) 4.22 - 5.81 MIL/uL   Hemoglobin 8.7 (L) 13.0 - 17.0 g/dL   HCT 40.926.2 (L) 81.139.0 - 91.452.0 %   MCV 91.0 78.0 - 100.0 fL   MCH 30.2 26.0 - 34.0 pg   MCHC 33.2 30.0 - 36.0 g/dL   RDW 78.213.6 95.611.5 - 21.315.5 %   Platelets 264 150 - 400 K/uL  Glucose, capillary     Status: Abnormal   Collection Time: 12/15/13 11:40 AM  Result Value Ref Range   Glucose-Capillary 175 (H) 70 - 99 mg/dL  Glucose, capillary     Status: Abnormal   Collection Time: 12/15/13  4:44 PM  Result Value Ref Range   Glucose-Capillary 150 (H) 70 - 99 mg/dL  Hemoglobin and hematocrit, blood     Status: Abnormal   Collection Time: 12/15/13  7:38 PM  Result Value Ref Range   Hemoglobin 6.4 (LL) 13.0 - 17.0 g/dL   HCT 08.618.8 (L) 57.839.0 - 46.952.0 %  Prepare RBC     Status: None   Collection Time: 12/15/13  8:36 PM  Result Value Ref Range   Order Confirmation ORDER PROCESSED BY BLOOD BANK   Glucose, capillary     Status: Abnormal   Collection Time: 12/15/13  10:13 PM  Result Value Ref Range   Glucose-Capillary 182 (H) 70 - 99 mg/dL   Comment 1 Notify RN    Comment 2 Documented in Chart   Hemoglobin and hematocrit, blood     Status: Abnormal   Collection Time: 12/16/13  6:08 AM  Result Value Ref Range   Hemoglobin 9.8 (L) 13.0 - 17.0 g/dL   HCT 62.929.6 (L) 52.839.0 - 41.352.0 %  Glucose, capillary     Status: Abnormal   Collection Time: 12/16/13  7:49 AM  Result Value Ref Range   Glucose-Capillary 128 (H) 70 - 99 mg/dL    Studies/Results: No results found.    Assessment: Gastrointestinal bleeding of uncertain etiology  Plan: Proceed with nuclear medicine GI bleeding scan. Continue supportive care.    Graylin ShiverGANEM,Teige Rountree F 12/16/2013, 9:58 AM  Lab Results  Component Value Date   HGB 9.8* 12/16/2013   HGB 6.4* 12/15/2013   HGB 8.7* 12/15/2013   HCT 29.6* 12/16/2013   HCT 18.8* 12/15/2013   HCT 26.2* 12/15/2013   ALKPHOS 39 12/14/2013   ALKPHOS 52 12/24/2010   ALKPHOS 87 06/02/2008   AST 11 12/14/2013   AST 38* 12/24/2010   AST 28 06/02/2008  ALT 14 12/14/2013   ALT 27 12/24/2010   ALT 55* 06/02/2008

## 2013-12-16 NOTE — Plan of Care (Signed)
Problem: Phase I Progression Outcomes Goal: OOB as tolerated unless otherwise ordered Outcome: Completed/Met Date Met:  12/16/13 Goal: Voiding-avoid urinary catheter unless indicated Outcome: Completed/Met Date Met:  12/16/13     

## 2013-12-16 NOTE — Procedures (Signed)
Post mesenteric arteriogram for GI bleed.   No definitive areas of vessel irregularity or active extravasation and as such, no embolization performed. No immediate complications.  Keep right leg straight for 3 hrs.

## 2013-12-16 NOTE — H&P (Signed)
Referring Physician(s): Lisabeth RegisterBuriev, Ganem  Subjective: 63 yo male with GI bleed of uncertain origin. Has had numerous abdominal/bowel surgeries in the past. Has had EGD and colonoscopy without identifying a source of bleed. He has NM RBC study today which is positive for active bleed though specific localization is difficult. IR requested to proceed with mesenteric angiogram in hopes of finding/embolizating source vessel Chart, PMHX, meds, labs reviewed. Pt has been NPO all day Has not received any recent anticoagulation  Past Medical History  Diagnosis Date  . Arthritis   . MRSA (methicillin resistant Staphylococcus aureus)   . Cellulitis of abdominal wall   . Walking pneumonia 06/29/2009  . Mass     recurrent chronically infected masses   . Diverticulitis   . Coronary artery disease   . COPD (chronic obstructive pulmonary disease)   . Smoking hx   . S/P CABG x 5   . Hypertension   . H1N1 influenza   . Pneumonia   . Complication of anesthesia     aspirated with extubation last surgery   Past Surgical History  Procedure Laterality Date  . Coronary artery bypass graft    . 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 Bowel resection  may 2010  . Colectomy      sigmoid colectomy  . Colostomy    . Colostomy takedown    . Hernia repair      ventral hernia   . Esophagogastroduodenoscopy N/A 12/14/2013    Procedure: ESOPHAGOGASTRODUODENOSCOPY (EGD);  Surgeon: Graylin ShiverSalem F Ganem, MD;  [facility];  Service: Endoscopy;  Laterality: N/A;  . Colonoscopy N/A 12/15/2013    Procedure: COLONOSCOPY;  Surgeon: Graylin ShiverSalem F Ganem, MD;  [facility];  Service: Endoscopy;  Laterality: N/A;   History   Social History  . Marital Status: Married    Spouse Name: N/A    Number of Children: N/A  . Years of Education: N/A   Occupational History  . Not on file.   Social History Main Topics  . Smoking status: Former Smoker -- 2.00 packs/day for 38 years    Types: Cigarettes   Quit date: 04/22/2002  . Smokeless tobacco: Never Used  . Alcohol Use: No  . Drug Use: No  . Sexual Activity: Not on file   Other Topics Concern  . Not on file   Social History Narrative   Allergies: Doxycycline; Morphine and related; and Penicillins  Medications: Prior to Admission medications   Medication Sig Start Date End Date Taking? Authorizing Provider  albuterol (PROVENTIL HFA;VENTOLIN HFA) 108 (90 BASE) MCG/ACT inhaler Inhale 2 puffs into the lungs every 6 (six) hours as needed for wheezing or shortness of breath. 04/21/13  Yes Lupita Leashouglas B McQuaid, MD  amLODipine (NORVASC) 5 MG tablet Take 5 mg by mouth daily.  09/12/10  Yes Historical Provider, MD  budesonide-formoterol (SYMBICORT) 160-4.5 MCG/ACT inhaler Inhale 2 puffs into the lungs 2 (two) times daily.    Yes Historical Provider, MD  CRESTOR 5 MG tablet Take 5 mg by mouth at bedtime.  09/12/10  Yes Historical Provider, MD  furosemide (LASIX) 20 MG tablet Take 20 mg by mouth. Takes MWF   Yes Historical Provider, MD  lisinopril (PRINIVIL,ZESTRIL) 10 MG tablet Take 20 mg by mouth every evening.    Yes Historical Provider, MD  metFORMIN (GLUCOPHAGE) 500 MG tablet Take 500-1,000 mg by mouth 2 (two) times daily. 500mg  in the morning and 1000mg  in the evening   Yes Historical Provider, MD  metoprolol (TOPROL-XL) 100  MG 24 hr tablet Take 100 mg by mouth every morning.  09/02/10  Yes Historical Provider, MD  Omega-3 Fatty Acids (FISH OIL) 1200 MG CAPS Take 2 capsules by mouth daily.    Yes Historical Provider, MD  Spacer/Aero-Holding Chambers (AEROCHAMBER MV) inhaler Use as instructed 04/21/13  Yes Lupita Leash, MD  mometasone-formoterol (DULERA) 100-5 MCG/ACT AERO Inhale 2 puffs into the lungs 2 (two) times daily. Patient not taking: Reported on 12/13/2013 04/22/13   Lupita Leash, MD    Review of Systems See HPI for pertinent findings  Vital Signs: BP 135/65 mmHg  Pulse 84  Temp(Src) 98.4 F (36.9 C) (Oral)  Resp 20  Ht 5'  10" (1.778 m)  Wt 228 lb (103.42 kg)  BMI 32.71 kg/m2  SpO2 95%  Physical Exam General: A&O x 3, NAD, sitting up in chair ENT: unremarkable airway Lungs: CTA without w/r/r Heart: Regular Abdomen: protuberant, soft, multiple surgical scars Ext: palpable femoral pulses   Imaging: Ct Abdomen Pelvis W Contrast  12/14/2013   CLINICAL DATA:  Acute onset of rectal bleeding and bilateral lower quadrant abdominal pain. Diarrhea. Initial encounter.  EXAM: CT ABDOMEN AND PELVIS WITH CONTRAST  TECHNIQUE: Multidetector CT imaging of the abdomen and pelvis was performed using the standard protocol following bolus administration of intravenous contrast.  CONTRAST:  OMNIPAQUE IOHEXOL 300 MG/ML  SOLN  COMPARISON:  CT of the abdomen and pelvis performed 11/04/2008  FINDINGS: The visualized lung bases are clear. Diffuse coronary artery calcification is seen.  The liver and spleen are unremarkable in appearance. The gallbladder is within normal limits. A relatively stable 1.5 cm adrenal adenoma is noted arising at the left adrenal gland. The right adrenal gland is unremarkable in appearance. The pancreas is within normal limits. Incidental note is made of a duodenal diverticulum at the head of the pancreas.  Nonspecific perinephric stranding is noted bilaterally. Scattered small bilateral renal cysts are seen. A prominent column of Bertin is noted on the right side. The kidneys are otherwise grossly unremarkable. There is no evidence of hydronephrosis. No renal or ureteral stones are seen.  No free fluid is identified. The small bowel is unremarkable in appearance. The stomach is within normal limits. No acute vascular abnormalities are seen. Mild scattered calcification is seen along the abdominal aorta and its branches.  The appendix is normal in caliber and contains air, without evidence for appendicitis. Mild diverticulosis is noted along the distal descending and proximal sigmoid colon, without evidence of  diverticulitis. A few tiny foci of increased attenuation at the proximal sigmoid colon are grossly stable from 2010 and may reflect chronic calcification. The colon is otherwise unremarkable.  The bladder is decompressed and not well assessed. The prostate remains normal in size, with scattered calcification. No inguinal lymphadenopathy is seen.  No acute osseous abnormalities are identified. Vacuum phenomenon is noted at L5-S1. Facet disease is noted at the lower lumbar spine.  IMPRESSION: 1. Mild diverticulosis along the distal descending and proximal sigmoid colon, without evidence of diverticulitis. No focal source of bleeding is identified on CT. 2. Diffuse coronary artery calcification seen. 3. Stable 1.5 cm adrenal adenoma at the left adrenal gland. 4. 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 5. Scattered small bilateral renal cysts seen. 6. Mild scattered calcification along the abdominal aorta and its branches.   Electronically Signed   By: Roanna Raider M.D.   On: 12/14/2013 00:20    Labs:  CBC:  Recent Labs  12/14/13 1720 12/14/13 2114 12/15/13 0525 12/15/13 1040 12/15/13 1938 12/16/13 0608 12/16/13 1120  WBC 10.1 11.9* 10.5 8.6  --   --   --   HGB 9.8* 8.9* 8.6* 8.7* 6.4* 9.8* 10.1*  HCT 30.5* 27.5* 26.8* 26.2* 18.8* 29.6* 30.0*  PLT 258 263 265 264  --   --   --     COAGS:  Recent Labs  12/14/13 0920  INR 1.10    BMP:  Recent Labs  12/13/13 2132 12/14/13 0920 12/15/13 0525  NA 142 142 140  K 4.2 4.2 4.3  CL 101 107 105  CO2  --  27 27  GLUCOSE 153* 142* 121*  BUN 34* 32* 20  CALCIUM  --  8.0* 7.9*  CREATININE 0.90 0.76 0.77  GFRNONAA  --  >90 >90  GFRAA  --  >90 >90    LIVER FUNCTION TESTS:  Recent Labs  12/14/13 0920  BILITOT <0.2*  AST 11  ALT 14  ALKPHOS 39  PROT 5.5*  ALBUMIN 2.7*    Assessment and Plan:  GI bleed without exact source. Plan to proceed with mesenteric arteriogram and possible  coil embolization Explained procedure to pt and wife. Explained risks, complications, possibility of negative study. Labs reviewed, ok Consent signed in chart     I spent a total of 20 minutes face to face in clinical consultation/evaluation, greater than 50% of which was counseling/coordinating care for GI bleed.  SignedBrayton El: Amara Manalang 12/16/2013, 4:56 PM

## 2013-12-16 NOTE — Sedation Documentation (Addendum)
O2 d/c'd, Exoseal [facility], RT

## 2013-12-16 NOTE — Progress Notes (Signed)
[facility] PROGRESS NOTE  Laverda SorensonFrank T Segel XBM:[id number] DOB: 04/01/1950 DOA: 12/13/2013 PCP: Josue HectorNYLAND,LEONARD ROBERT, MD  Assessment/Plan: 63 y.o. male with past medical history of coronary artery disease, s/p of CABG, s/p of colectomy, COPD, hypertension, diabetes mellitus, diastolic congestive heart failure, chronic left leg edema, hyperlipidemia, who presents with rectal bleeding and dark stool.  1. GIB; 12/2: s/p EGD/colon: diverticulosis. Old blood present in the colon -12/2 overnight more episodes of dark stool, Hg dropped to 6.4 improved after TF 3 units PRBCs -->Hg-9.8 -12/3 since 1.00 AM no new episodes of bleeding;  -obtain nuc med GI scan; cont close monitor; hemodynamically stable; GI is following; d/ced NSAIDS; cont PPI  -gentle IVF while NPO;   2. Acute blood loss anemia; monitor Hg, TF prn; as above  3. CAD s/p CABG; denies acute cardiopulmonary symptoms; not on antiplatelets  4. HTN, hole PO meds while NPO;  5. DM, HA1c-7.7; cont ISS: hold PO meds   Code Status: full Family Communication: d/w patient, his wife (indicate person spoken with, relationship, and if by phone, the number) Disposition Plan: pend clinical improvement    Consultants:  GI  Procedures: Colonoscopy: IMPRESSIONS: diverticulosis. Old blood present in the colon. No sign of active bleeding or stigmata of bleeding.  RECOMMENDATIONS: follow clinically. Advance diet. Watch for further signs of bleeding. If there are signs of active bleeding the next step would be to do a nuclear medicine GI bleeding scan. EGD: Esophagus: Normal  Stomach: Normal  Duodenum: Normal with a duodenal diverticulum present in the second portion of the duodenum with food in it.  No evidence of active bleeding or stigmata of bleeding seen on this examination    The scope was then withdrawn from the patient and the procedure completed.  Antibiotics:  None  (indicate start date, and stop date  if known)  HPI/Subjective: Alert, denies any pains  Objective: Filed Vitals:   12/16/13 0457  BP: 135/65  Pulse: 84  Temp: 98.4 F (36.9 C)  Resp: 20    Intake/Output Summary (Last 24 hours) at 12/16/13 0847 Last data filed at 12/16/13 0457  Gross per 24 hour  Intake   1985 ml  Output    504 ml  Net   1481 ml   Filed Weights   12/14/13 0515 12/15/13 0521 12/15/13 0733  Weight: 97.569 kg (215 lb 1.6 oz) 103.465 kg (228 lb 1.6 oz) 103.42 kg (228 lb)    Exam:   General:  alert  Cardiovascular: s1,s2 rrr  Respiratory: CTA BL  Abdomen: soft, nt,nd   Musculoskeletal: no LE edema    Data Reviewed: Basic Metabolic Panel:  Recent Labs Lab 12/13/13 2132 12/14/13 0920 12/15/13 0525  NA 142 142 140  K 4.2 4.2 4.3  CL 101 107 105  CO2  --  27 27  GLUCOSE 153* 142* 121*  BUN 34* 32* 20  CREATININE 0.90 0.76 0.77  CALCIUM  --  8.0* 7.9*   Liver Function Tests:  Recent Labs Lab 12/14/13 0920  AST 11  ALT 14  ALKPHOS 39  BILITOT <0.2*  PROT 5.5*  ALBUMIN 2.7*   No results for input(s): LIPASE, AMYLASE in the last 168 hours. No results for input(s): AMMONIA in the last 168 hours. CBC:  Recent Labs Lab 12/13/13 2115  12/14/13 0920 12/14/13 1720 12/14/13 2114 12/15/13 0525 12/15/13 1040 12/15/13 1938 12/16/13 0608  WBC 12.3*  < > 10.1 10.1 11.9* 10.5 8.6  --   --   NEUTROABS 7.8*  --   --   --   --   --   --   --   --  HGB 12.1*  < > 9.7* 9.8* 8.9* 8.6* 8.7* 6.4* 9.8*  HCT 36.9*  < > 29.6* 30.5* 27.5* 26.8* 26.2* 18.8* 29.6*  MCV 88.9  < > 88.6 88.9 88.4 89.0 91.0  --   --   PLT 327  < > 266 258 263 265 264  --   --   < > = values in this interval not displayed. Cardiac Enzymes: No results for input(s): CKTOTAL, CKMB, CKMBINDEX, TROPONINI in the last 168 hours. BNP (last 3 results) No results for input(s): PROBNP in the last 8760 hours. CBG:  Recent Labs Lab 12/15/13 0949 12/15/13 1140 12/15/13 1644 12/15/13 2213 12/16/13 0749   GLUCAP 123* 175* 150* 182* 128*    No results found for this or any previous visit (from the past 240 hour(s)).   Studies: No results found.  Scheduled Meds: . sodium chloride   Intravenous Once  . budesonide-formoterol  2 puff Inhalation BID  . fentaNYL  50 mcg Intravenous Once  . insulin aspart  0-9 Units Subcutaneous TID WC  . metoprolol succinate  100 mg Oral Daily  . ondansetron (ZOFRAN) IV  4 mg Intravenous Once  . pantoprazole (PROTONIX) IV  40 mg Intravenous Q12H  . rosuvastatin  5 mg Oral QHS   Continuous Infusions:    Principal Problem:   Rectal bleeding Active Problems:   S/P CABG x 5   Smoking hx   COPD (chronic obstructive pulmonary disease)   Coronary artery disease   Arthritis   Diabetes mellitus without complication    Time spent: >35 minutes     Esperanza SheetsBURIEV, Milda Lindvall N  [facility] Pager [phone]. If 7PM-7AM, please contact night-coverage at [email or web address], password [email or web address] 12/16/2013, 8:47 AM  LOS: 3 days

## 2013-12-17 LAB — GLUCOSE, CAPILLARY
GLUCOSE-CAPILLARY: 102 mg/dL — AB (ref 70–99)
GLUCOSE-CAPILLARY: 108 mg/dL — AB (ref 70–99)
Glucose-Capillary: 151 mg/dL — ABNORMAL HIGH (ref 70–99)
Glucose-Capillary: 141 mg/dL — ABNORMAL HIGH (ref 70–99)

## 2013-12-17 LAB — HEMOGLOBIN AND HEMATOCRIT, BLOOD
HCT: 30 % — ABNORMAL LOW (ref 39.0–52.0)
HCT: 27.8 % — ABNORMAL LOW (ref 39.0–52.0)
HEMOGLOBIN: 9.8 g/dL — AB (ref 13.0–17.0)
Hemoglobin: 9.4 g/dL — ABNORMAL LOW (ref 13.0–17.0)

## 2013-12-17 LAB — TYPE AND SCREEN
ABO/RH(D): A POS
ANTIBODY SCREEN: NEGATIVE
UNIT DIVISION: 0
Unit division: 0
Unit division: 0

## 2013-12-17 LAB — CBC
HCT: 27.4 % — ABNORMAL LOW (ref 39.0–52.0)
Hemoglobin: 9 g/dL — ABNORMAL LOW (ref 13.0–17.0)
MCH: 29.4 pg (ref 26.0–34.0)
MCHC: 32.8 g/dL (ref 30.0–36.0)
MCV: 89.5 fL (ref 78.0–100.0)
PLATELETS: 218 10*3/uL (ref 150–400)
RBC: 3.06 MIL/uL — AB (ref 4.22–5.81)
RDW: 14.3 % (ref 11.5–15.5)
WBC: 10.3 10*3/uL (ref 4.0–10.5)

## 2013-12-17 MED ORDER — ZOLPIDEM TARTRATE 5 MG PO TABS
5.0000 mg | ORAL_TABLET | Freq: Once | ORAL | Status: AC
  Administered 2013-12-17: 5 mg via ORAL

## 2013-12-17 MED ORDER — ZOLPIDEM TARTRATE 5 MG PO TABS
ORAL_TABLET | ORAL | Status: AC
  Filled 2013-12-17: qty 1

## 2013-12-17 NOTE — Plan of Care (Signed)
Problem: Phase II Progression Outcomes Goal: Progress activity as tolerated unless otherwise ordered Outcome: Completed/Met Date Met:  12/17/13 Goal: Vital signs remain stable Outcome: Completed/Met Date Met:  12/17/13 Goal: Obtain order to discontinue catheter if appropriate Outcome: Not Applicable Date Met:  05/24/00

## 2013-12-17 NOTE — Progress Notes (Signed)
[facility] PROGRESS NOTE  Daniel Norton YEB:[id number] DOB: 08/17/1950 DOA: 12/13/2013 PCP: Sherrie Mustache, MD  Assessment/Plan: 63 y.o. male with past medical history of coronary artery disease, s/p of CABG, s/p of colectomy, COPD, hypertension, diabetes mellitus, diastolic congestive heart failure, chronic left leg edema, hyperlipidemia, who presents with rectal bleeding and dark stool.  1. GIB; 12/2: s/p EGD/colon: diverticulosis. Old blood present in the colon -12/2 overnight more episodes of dark stool, Hg dropped to 6.4 improved after TF 3 units PRBCs -->Hg-9.8 -12/3 since 1.00 AM no new episodes of bleeding;  -s/p nuc med GI scan - GI on board, currently source is in small bowel and has stopped. If patient rebleeds he will need another angiogram as recommended by GI.  2. Acute blood loss anemia; monitor Hg, TF prn if hemoglobin less than 8.0 3. CAD s/p CABG; denies acute cardiopulmonary symptoms; not on antiplatelets , no chest pain reported, continue statin 4. HTN:  Patient is currently on metoprolol 5. DM, HA1c-7.7; cont SSI: hold PO meds   Code Status: full Family Communication: d/w patient, his wife (indicate person spoken with, relationship, and if by phone, the number) Disposition Plan: With resolution of GI bleed   Consultants:  GI  Procedures: Colonoscopy: IMPRESSIONS: diverticulosis. Old blood present in the colon. No sign of active bleeding or stigmata of bleeding.  RECOMMENDATIONS: follow clinically. Advance diet. Watch for further signs of bleeding. If there are signs of active bleeding the next step would be to do a nuclear medicine GI bleeding scan. EGD: Esophagus: Normal  Stomach: Normal  Duodenum: Normal with a duodenal diverticulum present in the second portion of the duodenum with food in it.  No evidence of active bleeding or stigmata of bleeding seen on this examination    The scope was then withdrawn from the  patient and the procedure completed.  Antibiotics:  None  (indicate start date, and stop date if known)  HPI/Subjective: The patient has no new complaints. He denies any abdominal discomfort or any active GI bleeding.  Objective: Filed Vitals:   12/17/13 1317  BP: 122/63  Pulse: 71  Temp: 98.3 F (36.8 C)  Resp: 17    Intake/Output Summary (Last 24 hours) at 12/17/13 1717 Last data filed at 12/17/13 1432  Gross per 24 hour  Intake 1306.67 ml  Output   1100 ml  Net 206.67 ml   Filed Weights   12/15/13 0521 12/15/13 0733 12/17/13 0900  Weight: 103.465 kg (228 lb 1.6 oz) 103.42 kg (228 lb) 102.558 kg (226 lb 1.6 oz)    Exam:   General:  Alert, awake in nad  Cardiovascular: s1,s2 no rubs  Respiratory: CTA BL, no wheezes  Abdomen: soft, nt,nd   Musculoskeletal: no LE edema    Data Reviewed: Basic Metabolic Panel:  Recent Labs Lab 12/13/13 2132 12/14/13 0920 12/15/13 0525  NA 142 142 140  K 4.2 4.2 4.3  CL 101 107 105  CO2  --  27 27  GLUCOSE 153* 142* 121*  BUN 34* 32* 20  CREATININE 0.90 0.76 0.77  CALCIUM  --  8.0* 7.9*   Liver Function Tests:  Recent Labs Lab 12/14/13 0920  AST 11  ALT 14  ALKPHOS 39  BILITOT <0.2*  PROT 5.5*  ALBUMIN 2.7*   No results for input(s): LIPASE, AMYLASE in the last 168 hours. No results for input(s): AMMONIA in the last 168 hours. CBC:  Recent Labs Lab 12/13/13 2115  12/14/13 1720 12/14/13 2114 12/15/13 0525 12/15/13  1040  12/16/13 0608 12/16/13 1120 12/16/13 2002 12/17/13 0407 12/17/13 1205  WBC 12.3*  < > 10.1 11.9* 10.5 8.6  --   --   --   --  10.3  --   NEUTROABS 7.8*  --   --   --   --   --   --   --   --   --   --   --   HGB 12.1*  < > 9.8* 8.9* 8.6* 8.7*  < > 9.8* 10.1* 8.9* 9.0* 9.8*  HCT 36.9*  < > 30.5* 27.5* 26.8* 26.2*  < > 29.6* 30.0* 26.7* 27.4* 30.0*  MCV 88.9  < > 88.9 88.4 89.0 91.0  --   --   --   --  89.5  --   PLT 327  < > 258 263 265 264  --   --   --   --  218  --   < > =  values in this interval not displayed. Cardiac Enzymes: No results for input(s): CKTOTAL, CKMB, CKMBINDEX, TROPONINI in the last 168 hours. BNP (last 3 results) No results for input(s): PROBNP in the last 8760 hours. CBG:  Recent Labs Lab 12/16/13 0749 12/16/13 1651 12/16/13 2131 12/17/13 0747 12/17/13 1132  GLUCAP 128* 115* 132* 151* 141*    No results found for this or any previous visit (from the past 240 hour(s)).   Studies: Nm Gi Blood Loss  12/16/2013   CLINICAL DATA:  Gastrointestinal hemorrhage, rectal bleeding since 12/14/2013, anemia  EXAM: NUCLEAR MEDICINE GASTROINTESTINAL BLEEDING SCAN  TECHNIQUE: Sequential abdominal images were obtained following intravenous administration of Tc-45mlabeled red blood cells.  RADIOPHARMACEUTICALS:  25 mCi Tc-97mn-vitro labeled autologous red cells.  COMPARISON:  None; correlation CT abdomen and pelvis 12/13/2013  FINDINGS: Abnormal exam demonstrating presence of abnormal intra-abdominal localization of labeled red cells medially after injection compatible with gastrointestinal bleeding.  The observed passage of tracer within the abdomen appears to be central and curvilinear and conforms to small bowel distribution rather than colon.  A moderate amount of free pertechnetate is identified within gastric mucosa and rapidly within urinary bladder.  IMPRESSION: Abnormal GI bleeding study demonstrating abnormal gastrointestinal localization of labeled red cells beginning immediately following injection, distribution most consistent with small bowel etiology.  Incidentally noted free pertechnetate within urinary bladder and gastric mucosa.   Electronically Signed   By: MaLavonia Dana.D.   On: 12/16/2013 16:28   Ir Angiogram Visceral Selective  12/17/2013   INDICATION: Acute GI bleed. Positive nuclear medicine tagged red blood cell bleeding scan, with likely source involving the proximal small bowel. Please perform diagnostic and potentially therapeutic  mesenteric angiography.  EXAM: 1. ULTRASOUND GUIDANCE FOR ARTERIAL ACCESS. 2. AT SELECTIVE CELIAC AND SUPERIOR MESENTERIC ARTERIOGRAMS.  COMPARISON:  CT abdomen pelvis - 12/13/2013; nuclear medicine tagged red blood cell scan - 12/16/2013  ANESTHESIA/SEDATION: Fentanyl 50 mcg IV; Versed 1 mg IV  MEDICATIONS: None  CONTRAST:  10022mMNIPAQUE IOHEXOL 300 MG/ML  SOLN  FLUOROSCOPY TIME:  4 minutes.  12 seconds (577098.1y)  COMPLICATIONS: None immediate  TECHNIQUE: Informed written consent was obtained from the patient after a discussion of the risks, benefits and alternatives to treatment. Questions regarding the procedure were encouraged and answered. A timeout was performed prior to the initiation of the procedure.  The right groin was prepped and draped in the usual sterile fashion, and a sterile drape was applied covering the operative field. Maximum barrier sterile technique with sterile  gowns and gloves were used for the procedure. A timeout was performed prior to the initiation of the procedure. Local anesthesia was provided with 1% lidocaine.  The right femoral head was marked fluoroscopically. Under ultrasound guidance, the right common femoral artery was accessed with a micropuncture kit after the overlying soft tissues were anesthetized with 1% lidocaine. An ultrasound image was saved for documentation purposes. The micropuncture sheath was exchanged for a 5 [place] vascular sheath over a Bentson wire. A closure arteriogram was performed through the side of the sheath confirming access within the right common femoral artery.  Over a Bentson wire, a Mickelson catheter was advanced to the level of the inferior thoracic aorta where it was back bled and flushed. The catheter was then utilized to select the celiac artery and a celiac arteriogram was performed.  The Mickelson catheter was then utilized to select the superior mesenteric artery and several superior mesenteric arteriograms were performed centered over  various quadrants of the abdomen.  The Mickelson catheter was then advanced cranially to re-select the celiac artery and a repeat celiac arteriogram was performed.  Finally, the [facility] catheter was again utilized select the superior mesenteric artery and a completion superior mesenteric arteriogram was performed.  All images were reviewed and the procedure was terminated. All wires catheters and sheaths were removed from the patient. Hemostasis was achieved at the right groin access site with a combination of deployment of an Exoseal closure device and manual compression. A dressing was placed. The patient tolerated the procedure well without immediate postprocedural complication.  FINDINGS: Celiac arteriogram demonstrates variant vascular anatomy with the left hepatic artery incidentally noted to arise from the left gastric artery, the gastroduodenal artery originating from the proximal aspect of the common hepatic artery as well as a prominent pancreaticoduodenal artery arising from the right hepatic artery. No definitive areas of vessel irregularity or active extravasation despite the acquisition of several selective superior mesenteric arteriograms.  Superior mesenteric arteriogram demonstrates conventional configuration. Despite the acquisition of several superior mesenteric arteriograms, no definitive areas of active extravasation or vessel irregularity were identified.  Given patient's known atherosclerotic disease (as demonstrated on preceding abdominal CT performed 12/13/2013), and as the area of bleeding appeared to arise from the proximal small bowel on the preceding tagged red blood cell scan, the IMA was not selectively catheterized.  IMPRESSION: 1. Negative mesenteric arteriogram. No definitive areas of vessel irregularity or active extravasation. No embolization performed. 2. Incidentally noted variant vascular anatomy as above.  PLAN: Would recommend continued conservative management. As the  tagged red blood cell study suggested a potential small bowel origin of the patient's GI bleed, further evaluation could be performed with capsule endoscopy as clinically indicated. Would only recommend repeat tagged red blood cell study in the setting of recurrent brisk GI bleeding and after discussion with interventional radiology, as the nuclear medicine study was only faintly positive.   Electronically Signed   By: Sandi Mariscal M.D.   On: 12/17/2013 10:21   Ir Angiogram Visceral Selective  12/17/2013   INDICATION: Acute GI bleed. Positive nuclear medicine tagged red blood cell bleeding scan, with likely source involving the proximal small bowel. Please perform diagnostic and potentially therapeutic mesenteric angiography.  EXAM: 1. ULTRASOUND GUIDANCE FOR ARTERIAL ACCESS. 2. AT SELECTIVE CELIAC AND SUPERIOR MESENTERIC ARTERIOGRAMS.  COMPARISON:  CT abdomen pelvis - 12/13/2013; nuclear medicine tagged red blood cell scan - 12/16/2013  ANESTHESIA/SEDATION: Fentanyl 50 mcg IV; Versed 1 mg IV  MEDICATIONS: None  CONTRAST:  171m  OMNIPAQUE IOHEXOL 300 MG/ML  SOLN  FLUOROSCOPY TIME:  4 minutes.  12 seconds (676.1 mGy)  COMPLICATIONS: None immediate  TECHNIQUE: Informed written consent was obtained from the patient after a discussion of the risks, benefits and alternatives to treatment. Questions regarding the procedure were encouraged and answered. A timeout was performed prior to the initiation of the procedure.  The right groin was prepped and draped in the usual sterile fashion, and a sterile drape was applied covering the operative field. Maximum barrier sterile technique with sterile gowns and gloves were used for the procedure. A timeout was performed prior to the initiation of the procedure. Local anesthesia was provided with 1% lidocaine.  The right femoral head was marked fluoroscopically. Under ultrasound guidance, the right common femoral artery was accessed with a micropuncture kit after the overlying  soft tissues were anesthetized with 1% lidocaine. An ultrasound image was saved for documentation purposes. The micropuncture sheath was exchanged for a 5 [place] vascular sheath over a Bentson wire. A closure arteriogram was performed through the side of the sheath confirming access within the right common femoral artery.  Over a Bentson wire, a Mickelson catheter was advanced to the level of the inferior thoracic aorta where it was back bled and flushed. The catheter was then utilized to select the celiac artery and a celiac arteriogram was performed.  The Mickelson catheter was then utilized to select the superior mesenteric artery and several superior mesenteric arteriograms were performed centered over various quadrants of the abdomen.  The Mickelson catheter was then advanced cranially to re-select the celiac artery and a repeat celiac arteriogram was performed.  Finally, the [facility] catheter was again utilized select the superior mesenteric artery and a completion superior mesenteric arteriogram was performed.  All images were reviewed and the procedure was terminated. All wires catheters and sheaths were removed from the patient. Hemostasis was achieved at the right groin access site with a combination of deployment of an Exoseal closure device and manual compression. A dressing was placed. The patient tolerated the procedure well without immediate postprocedural complication.  FINDINGS: Celiac arteriogram demonstrates variant vascular anatomy with the left hepatic artery incidentally noted to arise from the left gastric artery, the gastroduodenal artery originating from the proximal aspect of the common hepatic artery as well as a prominent pancreaticoduodenal artery arising from the right hepatic artery. No definitive areas of vessel irregularity or active extravasation despite the acquisition of several selective superior mesenteric arteriograms.  Superior mesenteric arteriogram demonstrates conventional  configuration. Despite the acquisition of several superior mesenteric arteriograms, no definitive areas of active extravasation or vessel irregularity were identified.  Given patient's known atherosclerotic disease (as demonstrated on preceding abdominal CT performed 12/13/2013), and as the area of bleeding appeared to arise from the proximal small bowel on the preceding tagged red blood cell scan, the IMA was not selectively catheterized.  IMPRESSION: 1. Negative mesenteric arteriogram. No definitive areas of vessel irregularity or active extravasation. No embolization performed. 2. Incidentally noted variant vascular anatomy as above.  PLAN: Would recommend continued conservative management. As the tagged red blood cell study suggested a potential small bowel origin of the patient's GI bleed, further evaluation could be performed with capsule endoscopy as clinically indicated. Would only recommend repeat tagged red blood cell study in the setting of recurrent brisk GI bleeding and after discussion with interventional radiology, as the nuclear medicine study was only faintly positive.   Electronically Signed   By: Sandi Mariscal M.D.   On: 12/17/2013  10:21   Ir US Guide Vasc Access Right  12/17/2013   INDICATION: Acute GI bleed. Positive nuclear medicine tagged red blood cell bleeding scan, with likely source involving the proximal small bowel. Please perform diagnostic and potentially therapeutic mesenteric angiography.  EXAM: 1. ULTRASOUND GUIDANCE FOR ARTERIAL ACCESS. 2. AT SELECTIVE CELIAC AND SUPERIOR MESENTERIC ARTERIOGRAMS.  COMPARISON:  CT abdomen pelvis - 12/13/2013; nuclear medicine tagged red blood cell scan - 12/16/2013  ANESTHESIA/SEDATION: Fentanyl 50 mcg IV; Versed 1 mg IV  MEDICATIONS: None  CONTRAST:  126m OMNIPAQUE IOHEXOL 300 MG/ML  SOLN  FLUOROSCOPY TIME:  4 minutes.  12 seconds (5423.5mGy)  COMPLICATIONS: None immediate  TECHNIQUE: Informed written consent was obtained from the patient after a  discussion of the risks, benefits and alternatives to treatment. Questions regarding the procedure were encouraged and answered. A timeout was performed prior to the initiation of the procedure.  The right groin was prepped and draped in the usual sterile fashion, and a sterile drape was applied covering the operative field. Maximum barrier sterile technique with sterile gowns and gloves were used for the procedure. A timeout was performed prior to the initiation of the procedure. Local anesthesia was provided with 1% lidocaine.  The right femoral head was marked fluoroscopically. Under ultrasound guidance, the right common femoral artery was accessed with a micropuncture kit after the overlying soft tissues were anesthetized with 1% lidocaine. An ultrasound image was saved for documentation purposes. The micropuncture sheath was exchanged for a 5 [place] sheath over a Bentson wire. A closure arteriogram was performed through the side of the sheath confirming access within the right common femoral artery.  Over a Bentson wire, a Mickelson catheter was advanced to the level of the inferior thoracic aorta where it was back bled and flushed. The catheter was then utilized to select the celiac artery and a celiac arteriogram was performed.  The Mickelson catheter was then utilized to select the superior mesenteric artery and several superior mesenteric arteriograms were performed centered over various quadrants of the abdomen.  The Mickelson catheter was then advanced cranially to re-select the celiac artery and a repeat celiac arteriogram was performed.  Finally, the [facility] was again utilized select the superior mesenteric artery and a completion superior mesenteric arteriogram was performed.  All images were reviewed and the procedure was terminated. All wires catheters and sheaths were removed from the patient. Hemostasis was achieved at the right groin access site with a combination of deployment  of an Exoseal closure device and manual compression. A dressing was placed. The patient tolerated the procedure well without immediate postprocedural complication.  FINDINGS: Celiac arteriogram demonstrates variant vascular anatomy with the left hepatic artery incidentally noted to arise from the left gastric artery, the gastroduodenal artery originating from the proximal aspect of the common hepatic artery as well as a prominent pancreaticoduodenal artery arising from the right hepatic artery. No definitive areas of vessel irregularity or active extravasation despite the acquisition of several selective superior mesenteric arteriograms.  Superior mesenteric arteriogram demonstrates conventional configuration. Despite the acquisition of several superior mesenteric arteriograms, no definitive areas of active extravasation or vessel irregularity were identified.  Given patient's known atherosclerotic disease (as demonstrated on preceding abdominal CT performed 12/13/2013), and as the area of bleeding appeared to arise from the proximal small bowel on the preceding tagged red blood cell scan, the IMA was not selectively catheterized.  IMPRESSION: 1. Negative mesenteric arteriogram. No definitive areas of vessel irregularity or active extravasation. No embolization  performed. 2. Incidentally noted variant vascular anatomy as above.  PLAN: Would recommend continued conservative management. As the tagged red blood cell study suggested a potential small bowel origin of the patient's GI bleed, further evaluation could be performed with capsule endoscopy as clinically indicated. Would only recommend repeat tagged red blood cell study in the setting of recurrent brisk GI bleeding and after discussion with interventional radiology, as the nuclear medicine study was only faintly positive.   Electronically Signed   By: Sandi Mariscal M.D.   On: 12/17/2013 10:21    Scheduled Meds: . sodium chloride   Intravenous Once  .  budesonide-formoterol  2 puff Inhalation BID  . fentaNYL  50 mcg Intravenous Once  . insulin aspart  0-9 Units Subcutaneous TID WC  . metoprolol succinate  100 mg Oral Daily  . ondansetron (ZOFRAN) IV  4 mg Intravenous Once  . pantoprazole (PROTONIX) IV  40 mg Intravenous Q12H  . rosuvastatin  5 mg Oral QHS   Continuous Infusions: . dextrose 5 % and 0.9% NaCl 50 mL/hr at 12/17/13 6962    Principal Problem:   Rectal bleeding Active Problems:   S/P CABG x 5   Smoking hx   COPD (chronic obstructive pulmonary disease)   Coronary artery disease   Arthritis   Diabetes mellitus without complication    Time spent: >35 minutes     Velvet Bathe  [facility] Pager [phone]. If 7PM-7AM, please contact night-coverage at [email or web address], password [facility] 12/17/2013, 5:17 PM  LOS: 4 days

## 2013-12-17 NOTE — Progress Notes (Signed)
[facility] Progress Note  Subjective: No signs of bleeding today. NM bleeding scan showed activity in small bowel but angiogram negative.  Objective: Vital signs in last 24 hours: Temp:  [98.2 F (36.8 C)-98.6 F (37 C)] 98.2 F (36.8 C) (12/04 16100632) Pulse Rate:  [72-81] 78 (12/04 0632) Resp:  [16-20] 17 (12/04 96040632) BP: (125-140)/(64-75) 139/70 mmHg (12/04 0632) SpO2:  [95 %-100 %] 95 % (12/04 54090632) Weight change:    PE: No distress Heart RRR Abdomen soft non tender  Lab Results: Results for orders placed or performed during the hospital encounter of 12/13/13 (from the past 24 hour(s))  Hemoglobin and hematocrit, blood     Status: Abnormal   Collection Time: 12/16/13 11:20 AM  Result Value Ref Range   Hemoglobin 10.1 (L) 13.0 - 17.0 g/dL   HCT 81.130.0 (L) 91.439.0 - 78.252.0 %  Glucose, capillary     Status: Abnormal   Collection Time: 12/16/13  4:51 PM  Result Value Ref Range   Glucose-Capillary 115 (H) 70 - 99 mg/dL  Hemoglobin and hematocrit, blood     Status: Abnormal   Collection Time: 12/16/13  8:02 PM  Result Value Ref Range   Hemoglobin 8.9 (L) 13.0 - 17.0 g/dL   HCT 95.626.7 (L) 21.339.0 - 08.652.0 %  Glucose, capillary     Status: Abnormal   Collection Time: 12/16/13  9:31 PM  Result Value Ref Range   Glucose-Capillary 132 (H) 70 - 99 mg/dL   Comment 1 Notify RN   CBC     Status: Abnormal   Collection Time: 12/17/13  4:07 AM  Result Value Ref Range   WBC 10.3 4.0 - 10.5 K/uL   RBC 3.06 (L) 4.22 - 5.81 MIL/uL   Hemoglobin 9.0 (L) 13.0 - 17.0 g/dL   HCT 57.827.4 (L) 46.939.0 - 62.952.0 %   MCV 89.5 78.0 - 100.0 fL   MCH 29.4 26.0 - 34.0 pg   MCHC 32.8 30.0 - 36.0 g/dL   RDW 52.814.3 41.311.5 - 24.415.5 %   Platelets 218 150 - 400 K/uL  Glucose, capillary     Status: Abnormal   Collection Time: 12/17/13  7:47 AM  Result Value Ref Range   Glucose-Capillary 151 (H) 70 - 99 mg/dL    Studies/Results: Nm Gi Blood Loss  12/16/2013   CLINICAL DATA:  Gastrointestinal hemorrhage, rectal  bleeding since 12/14/2013, anemia  EXAM: NUCLEAR MEDICINE GASTROINTESTINAL BLEEDING SCAN  TECHNIQUE: Sequential abdominal images were obtained following intravenous administration of Tc-7849m labeled red blood cells.  RADIOPHARMACEUTICALS:  25 mCi Tc-2449m in-vitro labeled autologous red cells.  COMPARISON:  None; correlation CT abdomen and pelvis 12/13/2013  FINDINGS: Abnormal exam demonstrating presence of abnormal intra-abdominal localization of labeled red cells medially after injection compatible with gastrointestinal bleeding.  The observed passage of tracer within the abdomen appears to be central and curvilinear and conforms to small bowel distribution rather than colon.  A moderate amount of free pertechnetate is identified within gastric mucosa and rapidly within urinary bladder.  IMPRESSION: Abnormal GI bleeding study demonstrating abnormal gastrointestinal localization of labeled red cells beginning immediately following injection, distribution most consistent with small bowel etiology.  Incidentally noted free pertechnetate within urinary bladder and gastric mucosa.   Electronically Signed   By: Ulyses SouthwardMark  Boles M.D.   On: 12/16/2013 16:28      Assessment: GI bleed. Small bowel source currently stopped.  Plan: Clear liquids. If he bleeds again will need another angiogram.    Joshus Rogan F 12/17/2013, 10:20 AM

## 2013-12-18 LAB — GLUCOSE, CAPILLARY
GLUCOSE-CAPILLARY: 170 mg/dL — AB (ref 70–99)
Glucose-Capillary: 140 mg/dL — ABNORMAL HIGH (ref 70–99)
Glucose-Capillary: 75 mg/dL (ref 70–99)
Glucose-Capillary: 92 mg/dL (ref 70–99)

## 2013-12-18 LAB — HEMOGLOBIN AND HEMATOCRIT, BLOOD
HCT: 30 % — ABNORMAL LOW (ref 39.0–52.0)
HCT: 28.2 % — ABNORMAL LOW (ref 39.0–52.0)
HEMATOCRIT: 27.7 % — AB (ref 39.0–52.0)
HEMOGLOBIN: 9.1 g/dL — AB (ref 13.0–17.0)
Hemoglobin: 9.7 g/dL — ABNORMAL LOW (ref 13.0–17.0)
Hemoglobin: 9.3 g/dL — ABNORMAL LOW (ref 13.0–17.0)

## 2013-12-18 MED ORDER — PANTOPRAZOLE SODIUM 40 MG PO TBEC
40.0000 mg | DELAYED_RELEASE_TABLET | Freq: Two times a day (BID) | ORAL | Status: DC
  Administered 2013-12-18 – 2013-12-19 (×3): 40 mg via ORAL
  Filled 2013-12-18: qty 1

## 2013-12-18 NOTE — Progress Notes (Signed)
[facility] PROGRESS NOTE  Daniel Norton AOZ:[id number] DOB: [birth year] DOA: 12/13/2013 PCP: Sherrie Mustache, MD  Assessment/Plan: 63 y.o. male with past medical history of coronary artery disease, s/p of CABG, s/p of colectomy, COPD, hypertension, diabetes mellitus, diastolic congestive heart failure, chronic left leg edema, hyperlipidemia, who presents with rectal bleeding and dark stool.  1. GIB; 12/2: s/p EGD/colon: diverticulosis. Old blood present in the colon -12/2 overnight more episodes of dark stool, Hg dropped to 6.4 improved after TF 3 units PRBCs -->Hg-9.8 -12/3 since 1.00 AM no new episodes of bleeding;  -s/p nuc med GI scan - GI on board, currently source is in small bowel and has stopped. If patient rebleeds he will need another angiogram as recommended by GI. - no bleeding as such diet will be advanced.  2. Acute blood loss anemia; monitor Hg, TF prn if hemoglobin less than 8.0 3. CAD s/p CABG; denies acute cardiopulmonary symptoms; not on antiplatelets , no chest pain reported, continue statin 4. HTN:  Patient is currently on metoprolol 5. DM, HA1c-7.7; cont SSI: hold PO meds   Code Status: full Family Communication: d/w patient, his wife (indicate person spoken with, relationship, and if by phone, the number) Disposition Plan: With resolution of GI bleed   Consultants:  GI  Procedures: Colonoscopy: IMPRESSIONS: diverticulosis. Old blood present in the colon. No sign of active bleeding or stigmata of bleeding.  RECOMMENDATIONS: follow clinically. Advance diet. Watch for further signs of bleeding. If there are signs of active bleeding the next step would be to do a nuclear medicine GI bleeding scan. EGD: Esophagus: Normal  Stomach: Normal  Duodenum: Normal with a duodenal diverticulum present in the second portion of the duodenum with food in it.  No evidence of active bleeding or stigmata of bleeding seen on  this examination    The scope was then withdrawn from the patient and the procedure completed.  Antibiotics:  None  (indicate start date, and stop date if known)  HPI/Subjective: The patient has no new complaints. Looking forward to eating  Objective: Filed Vitals:   12/18/13 1428  BP: 136/72  Pulse: 79  Temp: 98.1 F (36.7 C)  Resp: 18    Intake/Output Summary (Last 24 hours) at 12/18/13 1445 Last data filed at 12/18/13 1431  Gross per 24 hour  Intake   2161 ml  Output      0 ml  Net   2161 ml   Filed Weights   12/15/13 0733 12/17/13 0900 12/18/13 0623  Weight: 103.42 kg (228 lb) 102.558 kg (226 lb 1.6 oz) 101.379 kg (223 lb 8 oz)    Exam:   General:  Alert, awake in nad  Cardiovascular: s1,s2 no rubs  Respiratory: CTA BL, no wheezes  Abdomen: soft, nt,nd   Musculoskeletal: no LE edema    Data Reviewed: Basic Metabolic Panel:  Recent Labs Lab 12/13/13 2132 12/14/13 0920 12/15/13 0525  NA 142 142 140  K 4.2 4.2 4.3  CL 101 107 105  CO2  --  27 27  GLUCOSE 153* 142* 121*  BUN 34* 32* 20  CREATININE 0.90 0.76 0.77  CALCIUM  --  8.0* 7.9*   Liver Function Tests:  Recent Labs Lab 12/14/13 0920  AST 11  ALT 14  ALKPHOS 39  BILITOT <0.2*  PROT 5.5*  ALBUMIN 2.7*   No results for input(s): LIPASE, AMYLASE in the last 168 hours. No results for input(s): AMMONIA in the last 168 hours. CBC:  Recent Labs Lab  12/13/13 2115  12/14/13 1720 12/14/13 2114 12/15/13 0525 12/15/13 1040  12/17/13 0407 12/17/13 1205 12/17/13 1956 12/18/13 0342 12/18/13 1155  WBC 12.3*  < > 10.1 11.9* 10.5 8.6  --  10.3  --   --   --   --   NEUTROABS 7.8*  --   --   --   --   --   --   --   --   --   --   --   HGB 12.1*  < > 9.8* 8.9* 8.6* 8.7*  < > 9.0* 9.8* 9.4* 9.7* 9.1*  HCT 36.9*  < > 30.5* 27.5* 26.8* 26.2*  < > 27.4* 30.0* 27.8* 30.0* 27.7*  MCV 88.9  < > 88.9 88.4 89.0 91.0  --  89.5  --   --   --   --   PLT 327  < > 258 263 265 264  --  218  --    --   --   --   < > = values in this interval not displayed. Cardiac Enzymes: No results for input(s): CKTOTAL, CKMB, CKMBINDEX, TROPONINI in the last 168 hours. BNP (last 3 results) No results for input(s): PROBNP in the last 8760 hours. CBG:  Recent Labs Lab 12/17/13 1132 12/17/13 1732 12/17/13 2146 12/18/13 0746 12/18/13 1213  GLUCAP 141* 102* 108* 140* 170*    No results found for this or any previous visit (from the past 240 hour(s)).   Studies: Nm Gi Blood Loss  12/16/2013   CLINICAL DATA:  Gastrointestinal hemorrhage, rectal bleeding since 12/14/2013, anemia  EXAM: NUCLEAR MEDICINE GASTROINTESTINAL BLEEDING SCAN  TECHNIQUE: Sequential abdominal images were obtained following intravenous administration of Tc-70mlabeled red blood cells.  RADIOPHARMACEUTICALS:  25 mCi Tc-949mn-vitro labeled autologous red cells.  COMPARISON:  None; correlation CT abdomen and pelvis 12/13/2013  FINDINGS: Abnormal exam demonstrating presence of abnormal intra-abdominal localization of labeled red cells medially after injection compatible with gastrointestinal bleeding.  The observed passage of tracer within the abdomen appears to be central and curvilinear and conforms to small bowel distribution rather than colon.  A moderate amount of free pertechnetate is identified within gastric mucosa and rapidly within urinary bladder.  IMPRESSION: Abnormal GI bleeding study demonstrating abnormal gastrointestinal localization of labeled red cells beginning immediately following injection, distribution most consistent with small bowel etiology.  Incidentally noted free pertechnetate within urinary bladder and gastric mucosa.   Electronically Signed   By: MaLavonia Dana.D.   On: 12/16/2013 16:28   Ir Angiogram Visceral Selective  12/17/2013   INDICATION: Acute GI bleed. Positive nuclear medicine tagged red blood cell bleeding scan, with likely source involving the proximal small bowel. Please perform diagnostic and  potentially therapeutic mesenteric angiography.  EXAM: 1. ULTRASOUND GUIDANCE FOR ARTERIAL ACCESS. 2. AT SELECTIVE CELIAC AND SUPERIOR MESENTERIC ARTERIOGRAMS.  COMPARISON:  CT abdomen pelvis - 12/13/2013; nuclear medicine tagged red blood cell scan - 12/16/2013  ANESTHESIA/SEDATION: Fentanyl 50 mcg IV; Versed 1 mg IV  MEDICATIONS: None  CONTRAST:  10060mMNIPAQUE IOHEXOL 300 MG/ML  SOLN  FLUOROSCOPY TIME:  4 minutes.  12 seconds (577749.4y)  COMPLICATIONS: None immediate  TECHNIQUE: Informed written consent was obtained from the patient after a discussion of the risks, benefits and alternatives to treatment. Questions regarding the procedure were encouraged and answered. A timeout was performed prior to the initiation of the procedure.  The right groin was prepped and draped in the usual sterile fashion, and a sterile drape was applied  covering the operative field. Maximum barrier sterile technique with sterile gowns and gloves were used for the procedure. A timeout was performed prior to the initiation of the procedure. Local anesthesia was provided with 1% lidocaine.  The right femoral head was marked fluoroscopically. Under ultrasound guidance, the right common femoral artery was accessed with a micropuncture kit after the overlying soft tissues were anesthetized with 1% lidocaine. An ultrasound image was saved for documentation purposes. The micropuncture sheath was exchanged for a 5 [place] vascular sheath over a Bentson wire. A closure arteriogram was performed through the side of the sheath confirming access within the right common femoral artery.  Over a Bentson wire, a Mickelson catheter was advanced to the level of the inferior thoracic aorta where it was back bled and flushed. The catheter was then utilized to select the celiac artery and a celiac arteriogram was performed.  The Mickelson catheter was then utilized to select the superior mesenteric artery and several superior mesenteric arteriograms were  performed centered over various quadrants of the abdomen.  The Mickelson catheter was then advanced cranially to re-select the celiac artery and a repeat celiac arteriogram was performed.  Finally, the [facility] catheter was again utilized select the superior mesenteric artery and a completion superior mesenteric arteriogram was performed.  All images were reviewed and the procedure was terminated. All wires catheters and sheaths were removed from the patient. Hemostasis was achieved at the right groin access site with a combination of deployment of an Exoseal closure device and manual compression. A dressing was placed. The patient tolerated the procedure well without immediate postprocedural complication.  FINDINGS: Celiac arteriogram demonstrates variant vascular anatomy with the left hepatic artery incidentally noted to arise from the left gastric artery, the gastroduodenal artery originating from the proximal aspect of the common hepatic artery as well as a prominent pancreaticoduodenal artery arising from the right hepatic artery. No definitive areas of vessel irregularity or active extravasation despite the acquisition of several selective superior mesenteric arteriograms.  Superior mesenteric arteriogram demonstrates conventional configuration. Despite the acquisition of several superior mesenteric arteriograms, no definitive areas of active extravasation or vessel irregularity were identified.  Given patient's known atherosclerotic disease (as demonstrated on preceding abdominal CT performed 12/13/2013), and as the area of bleeding appeared to arise from the proximal small bowel on the preceding tagged red blood cell scan, the IMA was not selectively catheterized.  IMPRESSION: 1. Negative mesenteric arteriogram. No definitive areas of vessel irregularity or active extravasation. No embolization performed. 2. Incidentally noted variant vascular anatomy as above.  PLAN: Would recommend continued conservative  management. As the tagged red blood cell study suggested a potential small bowel origin of the patient's GI bleed, further evaluation could be performed with capsule endoscopy as clinically indicated. Would only recommend repeat tagged red blood cell study in the setting of recurrent brisk GI bleeding and after discussion with interventional radiology, as the nuclear medicine study was only faintly positive.   Electronically Signed   By: Sandi Mariscal M.D.   On: 12/17/2013 10:21   Ir Angiogram Visceral Selective  12/17/2013   INDICATION: Acute GI bleed. Positive nuclear medicine tagged red blood cell bleeding scan, with likely source involving the proximal small bowel. Please perform diagnostic and potentially therapeutic mesenteric angiography.  EXAM: 1. ULTRASOUND GUIDANCE FOR ARTERIAL ACCESS. 2. AT SELECTIVE CELIAC AND SUPERIOR MESENTERIC ARTERIOGRAMS.  COMPARISON:  CT abdomen pelvis - 12/13/2013; nuclear medicine tagged red blood cell scan - 12/16/2013  ANESTHESIA/SEDATION: Fentanyl 50 mcg IV; Versed  1 mg IV  MEDICATIONS: None  CONTRAST:  125m OMNIPAQUE IOHEXOL 300 MG/ML  SOLN  FLUOROSCOPY TIME:  4 minutes.  12 seconds (5103.1mGy)  COMPLICATIONS: None immediate  TECHNIQUE: Informed written consent was obtained from the patient after a discussion of the risks, benefits and alternatives to treatment. Questions regarding the procedure were encouraged and answered. A timeout was performed prior to the initiation of the procedure.  The right groin was prepped and draped in the usual sterile fashion, and a sterile drape was applied covering the operative field. Maximum barrier sterile technique with sterile gowns and gloves were used for the procedure. A timeout was performed prior to the initiation of the procedure. Local anesthesia was provided with 1% lidocaine.  The right femoral head was marked fluoroscopically. Under ultrasound guidance, the right common femoral artery was accessed with a micropuncture kit  after the overlying soft tissues were anesthetized with 1% lidocaine. An ultrasound image was saved for documentation purposes. The micropuncture sheath was exchanged for a 5 [place] sheath over a Bentson wire. A closure arteriogram was performed through the side of the sheath confirming access within the right common femoral artery.  Over a Bentson wire, a Mickelson catheter was advanced to the level of the inferior thoracic aorta where it was back bled and flushed. The catheter was then utilized to select the celiac artery and a celiac arteriogram was performed.  The Mickelson catheter was then utilized to select the superior mesenteric artery and several superior mesenteric arteriograms were performed centered over various quadrants of the abdomen.  The Mickelson catheter was then advanced cranially to re-select the celiac artery and a repeat celiac arteriogram was performed.  Finally, [facility] again utilized select the superior mesenteric artery and a completion superior mesenteric arteriogram was performed.  All images were reviewed and the procedure was terminated. All wires catheters and sheaths were removed from the patient. Hemostasis was achieved at the right groin access site with a combination of deployment of an Exoseal closure device and manual compression. A dressing was placed. The patient tolerated the procedure well without immediate postprocedural complication.  FINDINGS: Celiac arteriogram demonstrates variant vascular anatomy with the left hepatic artery incidentally noted to arise from the left gastric artery, the gastroduodenal artery originating from the proximal aspect of the common hepatic artery as well as a prominent pancreaticoduodenal artery arising from the right hepatic artery. No definitive areas of vessel irregularity or active extravasation despite the acquisition of several selective superior mesenteric arteriograms.  Superior mesenteric arteriogram  demonstrates conventional configuration. Despite the acquisition of several superior mesenteric arteriograms, no definitive areas of active extravasation or vessel irregularity were identified.  Given patient's known atherosclerotic disease (as demonstrated on preceding abdominal CT performed 12/13/2013), and as the area of bleeding appeared to arise from the proximal small bowel on the preceding tagged red blood cell scan, the IMA was not selectively catheterized.  IMPRESSION: 1. Negative mesenteric arteriogram. No definitive areas of vessel irregularity or active extravasation. No embolization performed. 2. Incidentally noted variant vascular anatomy as above.  PLAN: Would recommend continued conservative management. As the tagged red blood cell study suggested a potential small bowel origin of the patient's GI bleed, further evaluation could be performed with capsule endoscopy as clinically indicated. Would only recommend repeat tagged red blood cell study in the setting of recurrent brisk GI bleeding and after discussion with interventional radiology, as the nuclear medicine study was only faintly positive.   Electronically Signed  By: Sandi Mariscal M.D.   On: 12/17/2013 10:21   Ir US Guide Vasc Access Right  12/17/2013   INDICATION: Acute GI bleed. Positive nuclear medicine tagged red blood cell bleeding scan, with likely source involving the proximal small bowel. Please perform diagnostic and potentially therapeutic mesenteric angiography.  EXAM: 1. ULTRASOUND GUIDANCE FOR ARTERIAL ACCESS. 2. AT SELECTIVE CELIAC AND SUPERIOR MESENTERIC ARTERIOGRAMS.  COMPARISON:  CT abdomen pelvis - 12/13/2013; nuclear medicine tagged red blood cell scan - 12/16/2013  ANESTHESIA/SEDATION: Fentanyl 50 mcg IV; Versed 1 mg IV  MEDICATIONS: None  CONTRAST:  117m OMNIPAQUE IOHEXOL 300 MG/ML  SOLN  FLUOROSCOPY TIME:  4 minutes.  12 seconds (5784.6mGy)  COMPLICATIONS: None immediate  TECHNIQUE: Informed written consent was  obtained from the patient after a discussion of the risks, benefits and alternatives to treatment. Questions regarding the procedure were encouraged and answered. A timeout was performed prior to the initiation of the procedure.  The right groin was prepped and draped in the usual sterile fashion, and a sterile drape was applied covering the operative field. Maximum barrier sterile technique with sterile gowns and gloves were used for the procedure. A timeout was performed prior to the initiation of the procedure. Local anesthesia was provided with 1% lidocaine.  The right femoral head was marked fluoroscopically. Under ultrasound guidance, the right common femoral artery was accessed with a micropuncture kit after the overlying soft tissues were anesthetized with 1% lidocaine. An ultrasound image was saved for documentation purposes. The micropuncture sheath was exchanged for a 5 [place] sheath over a Bentson wire. A closure arteriogram was performed through the side of the sheath confirming access within the right common femoral artery.  Over a Bentson wire, a Mickelson catheter was advanced to the level of the inferior thoracic aorta where it was back bled and flushed. The catheter was then utilized to select the celiac artery and a celiac arteriogram was performed.  The Mickelson catheter was then utilized to select the superior mesenteric artery and several superior mesenteric arteriograms were performed centered over various quadrants of the abdomen.  The Mickelson catheter was then advanced cranially to re-select the celiac artery and a repeat celiac arteriogram was performed.  Finally, the [facility] was again utilized select the superior mesenteric artery and a completion superior mesenteric arteriogram was performed.  All images were reviewed and the procedure was terminated. All wires catheters and sheaths were removed from the patient. Hemostasis was achieved at the right groin access  site with a combination of deployment of an Exoseal closure device and manual compression. A dressing was placed. The patient tolerated the procedure well without immediate postprocedural complication.  FINDINGS: Celiac arteriogram demonstrates variant vascular anatomy with the left hepatic artery incidentally noted to arise from the left gastric artery, the gastroduodenal artery originating from the proximal aspect of the common hepatic artery as well as a prominent pancreaticoduodenal artery arising from the right hepatic artery. No definitive areas of vessel irregularity or active extravasation despite the acquisition of several selective superior mesenteric arteriograms.  Superior mesenteric arteriogram demonstrates conventional configuration. Despite the acquisition of several superior mesenteric arteriograms, no definitive areas of active extravasation or vessel irregularity were identified.  Given patient's known atherosclerotic disease (as demonstrated on preceding abdominal CT performed 12/13/2013), and as the area of bleeding appeared to arise from the proximal small bowel on the preceding tagged red blood cell scan, the IMA was not selectively catheterized.  IMPRESSION: 1. Negative mesenteric arteriogram. No definitive  areas of vessel irregularity or active extravasation. No embolization performed. 2. Incidentally noted variant vascular anatomy as above.  PLAN: Would recommend continued conservative management. As the tagged red blood cell study suggested a potential small bowel origin of the patient's GI bleed, further evaluation could be performed with capsule endoscopy as clinically indicated. Would only recommend repeat tagged red blood cell study in the setting of recurrent brisk GI bleeding and after discussion with interventional radiology, as the nuclear medicine study was only faintly positive.   Electronically Signed   By: Sandi Mariscal M.D.   On: 12/17/2013 10:21    Scheduled Meds: . sodium  chloride   Intravenous Once  . budesonide-formoterol  2 puff Inhalation BID  . fentaNYL  50 mcg Intravenous Once  . insulin aspart  0-9 Units Subcutaneous TID WC  . metoprolol succinate  100 mg Oral Daily  . ondansetron (ZOFRAN) IV  4 mg Intravenous Once  . pantoprazole  40 mg Oral BID  . rosuvastatin  5 mg Oral QHS   Continuous Infusions:    Principal Problem:   Rectal bleeding Active Problems:   S/P CABG x 5   Smoking hx   COPD (chronic obstructive pulmonary disease)   Coronary artery disease   Arthritis   Diabetes mellitus without complication    Time spent: >35 minutes     Velvet Bathe  [facility] Pager [phone]. If 7PM-7AM, please contact night-coverage at [email or web address], password [facility] 12/18/2013, 2:45 PM  LOS: 5 days

## 2013-12-18 NOTE — Progress Notes (Signed)
No signs of any further active GI bleeding overnight. Hemoglobin stable. Patient has been up walking around.  Plan: Slowly advance diet. Watch for further bleeding.

## 2013-12-18 NOTE — Plan of Care (Signed)
Problem: Phase I Progression Outcomes Goal: Hemodynamically stable Outcome: Completed/Met Date Met:  12/18/13

## 2013-12-19 LAB — HEMOGLOBIN AND HEMATOCRIT, BLOOD
HCT: 28.1 % — ABNORMAL LOW (ref 39.0–52.0)
HEMATOCRIT: 29.6 % — AB (ref 39.0–52.0)
HEMOGLOBIN: 9.7 g/dL — AB (ref 13.0–17.0)
Hemoglobin: 9.3 g/dL — ABNORMAL LOW (ref 13.0–17.0)

## 2013-12-19 LAB — GLUCOSE, CAPILLARY
GLUCOSE-CAPILLARY: 118 mg/dL — AB (ref 70–99)
Glucose-Capillary: 118 mg/dL — ABNORMAL HIGH (ref 70–99)

## 2013-12-19 MED ORDER — FERROUS SULFATE 325 (65 FE) MG PO TABS: 325.0000 mg | ORAL_TABLET | Freq: Every day | ORAL | Status: AC

## 2013-12-19 MED ORDER — PANTOPRAZOLE SODIUM 40 MG PO TBEC: 40.0000 mg | DELAYED_RELEASE_TABLET | Freq: Two times a day (BID) | ORAL | Status: AC

## 2013-12-19 NOTE — Plan of Care (Signed)
Problem: Phase III Progression Outcomes Goal: Voiding independently Outcome: Completed/Met Date Met:  12/19/13 Goal: IV/normal saline lock discontinued Outcome: Completed/Met Date Met:  12/19/13 Goal: Foley discontinued Outcome: Completed/Met Date Met:  12/19/13  Problem: Discharge Progression Outcomes Goal: Discharge plan in place and appropriate Outcome: Completed/Met Date Met:  12/19/13 Goal: Pain controlled with appropriate interventions Outcome: Completed/Met Date Met:  12/19/13 Goal: Hemodynamically stable Outcome: Completed/Met Date Met:  18/34/37 Goal: Complications resolved/controlled Outcome: Completed/Met Date Met:  12/19/13 Goal: Tolerating diet Outcome: Completed/Met Date Met:  12/19/13 Goal: Activity appropriate for discharge plan Outcome: Completed/Met Date Met:  12/19/13

## 2013-12-19 NOTE — Progress Notes (Signed)
[facility] Progress Note  Subjective: Doing well. No signs of further bleeding.  Objective: Vital signs in last 24 hours: Temp:  [98.1 F (36.7 C)-98.4 F (36.9 C)] 98.4 F (36.9 C) (12/05 2133) Pulse Rate:  [70-79] 70 (12/05 2133) Resp:  [17-18] 17 (12/05 2133) BP: (123-136)/(69-72) 123/69 mmHg (12/05 2133) SpO2:  [96 %-98 %] 98 % (12/05 2133) Weight change:    PE: No distress Heart RRR Abdomen non tender  Lab Results: Results for orders placed or performed during the hospital encounter of 12/13/13 (from the past 24 hour(s))  Hemoglobin and hematocrit, blood     Status: Abnormal   Collection Time: 12/18/13 11:55 AM  Result Value Ref Range   Hemoglobin 9.1 (L) 13.0 - 17.0 g/dL   HCT 78.427.7 (L) 69.639.0 - 29.552.0 %  Glucose, capillary     Status: Abnormal   Collection Time: 12/18/13 12:13 PM  Result Value Ref Range   Glucose-Capillary 170 (H) 70 - 99 mg/dL  Glucose, capillary     Status: None   Collection Time: 12/18/13  4:59 PM  Result Value Ref Range   Glucose-Capillary 75 70 - 99 mg/dL  Hemoglobin and hematocrit, blood     Status: Abnormal   Collection Time: 12/18/13  7:51 PM  Result Value Ref Range   Hemoglobin 9.3 (L) 13.0 - 17.0 g/dL   HCT 28.428.2 (L) 13.239.0 - 44.052.0 %  Glucose, capillary     Status: None   Collection Time: 12/18/13  9:30 PM  Result Value Ref Range   Glucose-Capillary 92 70 - 99 mg/dL   Comment 1 Notify RN   Hemoglobin and hematocrit, blood     Status: Abnormal   Collection Time: 12/19/13  5:26 AM  Result Value Ref Range   Hemoglobin 9.7 (L) 13.0 - 17.0 g/dL   HCT 10.229.6 (L) 72.539.0 - 36.652.0 %  Glucose, capillary     Status: Abnormal   Collection Time: 12/19/13  8:02 AM  Result Value Ref Range   Glucose-Capillary 118 (H) 70 - 99 mg/dL    Studies/Results: No results found.    Assessment: GI bleed. Stable. Source small bowel based on GI bleed sca  Plan: Appears stable for discharge. Follow up with me in 2 or 3 weeks.    Graylin ShiverGANEM,Deysi Soldo  F 12/19/2013, 10:43 AM  Lab Results  Component Value Date   HGB 9.7* 12/19/2013   HGB 9.3* 12/18/2013   HGB 9.1* 12/18/2013   HCT 29.6* 12/19/2013   HCT 28.2* 12/18/2013   HCT 27.7* 12/18/2013   ALKPHOS 39 12/14/2013   ALKPHOS 52 12/24/2010   ALKPHOS 87 06/02/2008   AST 11 12/14/2013   AST 38* 12/24/2010   AST 28 06/02/2008   ALT 14 12/14/2013   ALT 27 12/24/2010   ALT 55* 06/02/2008

## 2013-12-19 NOTE — Discharge Summary (Signed)
Physician Discharge Summary  Daniel Norton TRO:[id number] DOB: May 29, 1950 DOA: 12/13/2013  PCP: [name], MD  Admit date: 12/13/2013 Discharge date: 12/19/2013  Time spent: > 35 minutes  Recommendations for Outpatient Follow-up:  1. Please reassess hgb levels within the next 2-3 weeks  Discharge Diagnoses:  Principal Problem:   Rectal bleeding Active Problems:   S/P CABG x 5   Smoking hx   COPD (chronic obstructive pulmonary disease)   Coronary artery disease   Arthritis   Diabetes mellitus without complication   Discharge Condition: stable  Diet recommendation: bland diet  Filed Weights   12/15/13 0733 12/17/13 0900 12/18/13 7717  Weight: 103.42 kg (228 lb) 102.558 kg (226 lb 1.6 oz) 101.379 kg (223 lb 8 oz)    History of present illness:  63 y/o CM that presented to the hospital with painless GI bleeding  Hospital Course:  Rectal bleeding - resolved without intervention - Source small bowel based on GI been scan - recommended patient f/u with GI as outpatient for further evaluation and recommendations  For other stable medical problems listed above will continue home regimen  Procedures:  GI bleeding scan  Consultations:  GI  Discharge Exam: Filed Vitals:   12/18/13 2133  BP: 123/69  Pulse: 70  Temp: 98.4 F (36.9 C)  Resp: 17    General: Pt in nad, alert and awake Cardiovascular: rrr, no mrg Respiratory: cta bl, no wheezes  Discharge Instructions You were cared for by a hospitalist during your hospital stay. If you have any questions about your discharge medications or the care you received while you were in the hospital after you are discharged, you can call the unit and asked to speak with the hospitalist on call if the hospitalist that took care of you is not available. Once you are discharged, your primary care physician will handle any further medical issues. Please note that NO REFILLS for any discharge medications will be  authorized once you are discharged, as it is imperative that you return to your primary care physician (or establish a relationship with a primary care physician if you do not have one) for your aftercare needs so that they can reassess your need for medications and monitor your lab values.  Discharge Instructions    Call MD for:  extreme fatigue    Complete by:  As directed      Call MD for:  persistant nausea and vomiting    Complete by:  As directed      Call MD for:  temperature >100.4    Complete by:  As directed      Diet - low sodium heart healthy    Complete by:  As directed      Increase activity slowly    Complete by:  As directed           Current Discharge Medication List    START taking these medications   Details  ferrous sulfate 325 (65 FE) MG tablet Take 1 tablet (325 mg total) by mouth daily with breakfast. Qty: 30 tablet, Refills: 0    pantoprazole (PROTONIX) 40 MG tablet Take 1 tablet (40 mg total) by mouth 2 (two) times daily. Qty: 60 tablet, Refills: 0      CONTINUE these medications which have NOT CHANGED   Details  albuterol (PROVENTIL HFA;VENTOLIN HFA) 108 (90 BASE) MCG/ACT inhaler Inhale 2 puffs into the lungs every 6 (six) hours as needed for wheezing or shortness of breath. Qty: 1 Inhaler, Refills: 6  amLODipine (NORVASC) 5 MG tablet Take 5 mg by mouth daily.     budesonide-formoterol (SYMBICORT) 160-4.5 MCG/ACT inhaler Inhale 2 puffs into the lungs 2 (two) times daily.     CRESTOR 5 MG tablet Take 5 mg by mouth at bedtime.     furosemide (LASIX) 20 MG tablet Take 20 mg by mouth. Takes MWF    lisinopril (PRINIVIL,ZESTRIL) 10 MG tablet Take 20 mg by mouth every evening.     metFORMIN (GLUCOPHAGE) 500 MG tablet Take 500-1,000 mg by mouth 2 (two) times daily. $RemoveBefo'500mg'mpvkTnOYHSL$  in the morning and $RemoveBef'1000mg'ksbUnYzzzH$  in the evening    metoprolol (TOPROL-XL) 100 MG 24 hr tablet Take 100 mg by mouth every morning.     Omega-3 Fatty Acids (FISH OIL) 1200 MG CAPS Take 2  capsules by mouth daily.     Spacer/Aero-Holding Chambers (AEROCHAMBER MV) inhaler Use as instructed Qty: 1 each, Refills: 0      STOP taking these medications     mometasone-formoterol (DULERA) 100-5 MCG/ACT AERO        Allergies  Allergen Reactions  . Doxycycline     Skin rash (family not sure that this is a true reaction. Pt was in the sun when he had the reaction)  . Morphine And Related     Hallucinations   . Penicillins     Childhood reaction      The results of significant diagnostics from this hospitalization (including imaging, microbiology, ancillary and laboratory) are listed below for reference.    Significant Diagnostic Studies: Nm Gi Blood Loss  12/16/2013   CLINICAL DATA:  Gastrointestinal hemorrhage, rectal bleeding since 12/14/2013, anemia  EXAM: NUCLEAR MEDICINE GASTROINTESTINAL BLEEDING SCAN  TECHNIQUE: Sequential abdominal images were obtained following intravenous administration of Tc-39m labeled red blood cells.  RADIOPHARMACEUTICALS:  25 mCi Tc-57m in-vitro labeled autologous red cells.  COMPARISON:  None; correlation CT abdomen and pelvis 12/13/2013  FINDINGS: Abnormal exam demonstrating presence of abnormal intra-abdominal localization of labeled red cells medially after injection compatible with gastrointestinal bleeding.  The observed passage of tracer within the abdomen appears to be central and curvilinear and conforms to small bowel distribution rather than colon.  A moderate amount of free pertechnetate is identified within gastric mucosa and rapidly within urinary bladder.  IMPRESSION: Abnormal GI bleeding study demonstrating abnormal gastrointestinal localization of labeled red cells beginning immediately following injection, distribution most consistent with small bowel etiology.  Incidentally noted free pertechnetate within urinary bladder and gastric mucosa.   Electronically Signed   By: Lavonia Dana M.D.   On: 12/16/2013 16:28   Ct Abdomen Pelvis W  Contrast  12/14/2013   CLINICAL DATA:  Acute onset of rectal bleeding and bilateral lower quadrant abdominal pain. Diarrhea. Initial encounter.  EXAM: CT ABDOMEN AND PELVIS WITH CONTRAST  TECHNIQUE: Multidetector CT imaging of the abdomen and pelvis was performed using the standard protocol following bolus administration of intravenous contrast.  CONTRAST:  158mL OMNIPAQUE IOHEXOL 300 MG/ML  SOLN  COMPARISON:  CT of the abdomen and pelvis performed 11/04/2008  FINDINGS: The visualized lung bases are clear. Diffuse coronary artery calcification is seen.  The liver and spleen are unremarkable in appearance. The gallbladder is within normal limits. A relatively stable 1.5 cm adrenal adenoma is noted arising at the left adrenal gland. The right adrenal gland is unremarkable in appearance. The pancreas is within normal limits. Incidental note is made of a duodenal diverticulum at the head of the pancreas.  Nonspecific perinephric stranding is noted bilaterally. Scattered small  bilateral renal cysts are seen. A prominent column of Bertin is noted on the right side. The kidneys are otherwise grossly unremarkable. There is no evidence of hydronephrosis. No renal or ureteral stones are seen.  No free fluid is identified. The small bowel is unremarkable in appearance. The stomach is within normal limits. No acute vascular abnormalities are seen. Mild scattered calcification is seen along the abdominal aorta and its branches.  The appendix is normal in caliber and contains air, without evidence for appendicitis. Mild diverticulosis is noted along the distal descending and proximal sigmoid colon, without evidence of diverticulitis. A few tiny foci of increased attenuation at the proximal sigmoid colon are grossly stable from 2010 and may reflect chronic calcification. The colon is otherwise unremarkable.  The bladder is decompressed and not well assessed. The prostate remains normal in size, with scattered calcification. No  inguinal lymphadenopathy is seen.  No acute osseous abnormalities are identified. Vacuum phenomenon is noted at L5-S1. Facet disease is noted at the lower lumbar spine.  IMPRESSION: 1. Mild diverticulosis along the distal descending and proximal sigmoid colon, without evidence of diverticulitis. No focal source of bleeding is identified on CT. 2. Diffuse coronary artery calcification seen. 3. Stable 1.5 cm adrenal adenoma at the left adrenal gland. 4. 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 5. Scattered small bilateral renal cysts seen. 6. Mild scattered calcification along the abdominal aorta and its branches.   Electronically Signed   By: Garald Balding M.D.   On: 12/14/2013 00:20   Ir Angiogram Visceral Selective  12/17/2013   INDICATION: Acute GI bleed. Positive nuclear medicine tagged red blood cell bleeding scan, with likely source involving the proximal small bowel. Please perform diagnostic and potentially therapeutic mesenteric angiography.  EXAM: 1. ULTRASOUND GUIDANCE FOR ARTERIAL ACCESS. 2. AT SELECTIVE CELIAC AND SUPERIOR MESENTERIC ARTERIOGRAMS.  COMPARISON:  CT abdomen pelvis - 12/13/2013; nuclear medicine tagged red blood cell scan - 12/16/2013  ANESTHESIA/SEDATION: Fentanyl 50 mcg IV; Versed 1 mg IV  MEDICATIONS: None  CONTRAST:  149mL OMNIPAQUE IOHEXOL 300 MG/ML  SOLN  FLUOROSCOPY TIME:  4 minutes.  12 seconds (409.7 mGy)  COMPLICATIONS: None immediate  TECHNIQUE: Informed written consent was obtained from the patient after a discussion of the risks, benefits and alternatives to treatment. Questions regarding the procedure were encouraged and answered. A timeout was performed prior to the initiation of the procedure.  The right groin was prepped and draped in the usual sterile fashion, and a sterile drape was applied covering the operative field. Maximum barrier sterile technique with sterile gowns and gloves were used for the procedure. A timeout was  performed prior to the initiation of the procedure. Local anesthesia was provided with 1% lidocaine.  The right femoral head was marked fluoroscopically. Under ultrasound guidance, the right common femoral artery was accessed with a micropuncture kit after the overlying soft tissues were anesthetized with 1% lidocaine. An ultrasound image was saved for documentation purposes. The micropuncture sheath was exchanged for a 5 [place] vascular sheath over a Bentson wire. A closure arteriogram was performed through the side of the sheath confirming access within the right common femoral artery.  Over a Bentson wire, a Mickelson catheter was advanced to the level of the inferior thoracic aorta where it was back bled and flushed. The catheter was then utilized to select the celiac artery and a celiac arteriogram was performed.  The Mickelson catheter was then utilized to select the superior mesenteric artery and several  superior mesenteric arteriograms were performed centered over various quadrants of the abdomen.  The Mickelson catheter was then advanced cranially to re-select the celiac artery and a repeat celiac arteriogram was performed.  Finally, the [facility] catheter was again utilized select the superior mesenteric artery and a completion superior mesenteric arteriogram was performed.  All images were reviewed and the procedure was terminated. All wires catheters and sheaths were removed from the patient. Hemostasis was achieved at the right groin access site with a combination of deployment of an Exoseal closure device and manual compression. A dressing was placed. The patient tolerated the procedure well without immediate postprocedural complication.  FINDINGS: Celiac arteriogram demonstrates variant vascular anatomy with the left hepatic artery incidentally noted to arise from the left gastric artery, the gastroduodenal artery originating from the proximal aspect of the common hepatic artery as well as a prominent  pancreaticoduodenal artery arising from the right hepatic artery. No definitive areas of vessel irregularity or active extravasation despite the acquisition of several selective superior mesenteric arteriograms.  Superior mesenteric arteriogram demonstrates conventional configuration. Despite the acquisition of several superior mesenteric arteriograms, no definitive areas of active extravasation or vessel irregularity were identified.  Given patient's known atherosclerotic disease (as demonstrated on preceding abdominal CT performed 12/13/2013), and as the area of bleeding appeared to arise from the proximal small bowel on the preceding tagged red blood cell scan, the IMA was not selectively catheterized.  IMPRESSION: 1. Negative mesenteric arteriogram. No definitive areas of vessel irregularity or active extravasation. No embolization performed. 2. Incidentally noted variant vascular anatomy as above.  PLAN: Would recommend continued conservative management. As the tagged red blood cell study suggested a potential small bowel origin of the patient's GI bleed, further evaluation could be performed with capsule endoscopy as clinically indicated. Would only recommend repeat tagged red blood cell study in the setting of recurrent brisk GI bleeding and after discussion with interventional radiology, as the nuclear medicine study was only faintly positive.   Electronically Signed   By: Simonne Come M.D.   On: 12/17/2013 10:21   Ir Angiogram Visceral Selective  12/17/2013   INDICATION: Acute GI bleed. Positive nuclear medicine tagged red blood cell bleeding scan, with likely source involving the proximal small bowel. Please perform diagnostic and potentially therapeutic mesenteric angiography.  EXAM: 1. ULTRASOUND GUIDANCE FOR ARTERIAL ACCESS. 2. AT SELECTIVE CELIAC AND SUPERIOR MESENTERIC ARTERIOGRAMS.  COMPARISON:  CT abdomen pelvis - 12/13/2013; nuclear medicine tagged red blood cell scan - 12/16/2013   ANESTHESIA/SEDATION: Fentanyl 50 mcg IV; Versed 1 mg IV  MEDICATIONS: None  CONTRAST:  OMNIPAQUE IOHEXOL 300 MG/ML  SOLN  FLUOROSCOPY TIME:  4 minutes.  12 seconds (577.6 mGy)  COMPLICATIONS: None immediate  TECHNIQUE: Informed written consent was obtained from the patient after a discussion of the risks, benefits and alternatives to treatment. Questions regarding the procedure were encouraged and answered. A timeout was performed prior to the initiation of the procedure.  The right groin was prepped and draped in the usual sterile fashion, and a sterile drape was applied covering the operative field. Maximum barrier sterile technique with sterile gowns and gloves were used for the procedure. A timeout was performed prior to the initiation of the procedure. Local anesthesia was provided with 1% lidocaine.  The right femoral head was marked fluoroscopically. Under ultrasound guidance, the right common femoral artery was accessed with a micropuncture kit after the overlying soft tissues were anesthetized with 1% lidocaine. An ultrasound image was saved for documentation purposes.  The micropuncture sheath was exchanged for a 5 [place] vascular sheath over a Bentson wire. A closure arteriogram was performed through the side of the sheath confirming access within the right common femoral artery.  Over a Bentson wire, a Mickelson catheter was advanced to the level of the inferior thoracic aorta where it was back bled and flushed. The catheter was then utilized to select the celiac artery and a celiac arteriogram was performed.  The Mickelson catheter was then utilized to select the superior mesenteric artery and several superior mesenteric arteriograms were performed centered over various quadrants of the abdomen.  The Mickelson catheter was then advanced cranially to re-select the celiac artery and a repeat celiac arteriogram was performed.  Finally, the [facility] catheter was again utilized select the superior  mesenteric artery and a completion superior mesenteric arteriogram was performed.  All images were reviewed and the procedure was terminated. All wires catheters and sheaths were removed from the patient. Hemostasis was achieved at the right groin access site with a combination of deployment of an Exoseal closure device and manual compression. A dressing was placed. The patient tolerated the procedure well without immediate postprocedural complication.  FINDINGS: Celiac arteriogram demonstrates variant vascular anatomy with the left hepatic artery incidentally noted to arise from the left gastric artery, the gastroduodenal artery originating from the proximal aspect of the common hepatic artery as well as a prominent pancreaticoduodenal artery arising from the right hepatic artery. No definitive areas of vessel irregularity or active extravasation despite the acquisition of several selective superior mesenteric arteriograms.  Superior mesenteric arteriogram demonstrates conventional configuration. Despite the acquisition of several superior mesenteric arteriograms, no definitive areas of active extravasation or vessel irregularity were identified.  Given patient's known atherosclerotic disease (as demonstrated on preceding abdominal CT performed 12/13/2013), and as the area of bleeding appeared to arise from the proximal small bowel on the preceding tagged red blood cell scan, the IMA was not selectively catheterized.  IMPRESSION: 1. Negative mesenteric arteriogram. No definitive areas of vessel irregularity or active extravasation. No embolization performed. 2. Incidentally noted variant vascular anatomy as above.  PLAN: Would recommend continued conservative management. As the tagged red blood cell study suggested a potential small bowel origin of the patient's GI bleed, further evaluation could be performed with capsule endoscopy as clinically indicated. Would only recommend repeat tagged red blood cell study in  the setting of recurrent brisk GI bleeding and after discussion with interventional radiology, as the nuclear medicine study was only faintly positive.   Electronically Signed   By: Sandi Mariscal M.D.   On: 12/17/2013 10:21   Ir US Guide Vasc Access Right  12/17/2013   INDICATION: Acute GI bleed. Positive nuclear medicine tagged red blood cell bleeding scan, with likely source involving the proximal small bowel. Please perform diagnostic and potentially therapeutic mesenteric angiography.  EXAM: 1. ULTRASOUND GUIDANCE FOR ARTERIAL ACCESS. 2. AT SELECTIVE CELIAC AND SUPERIOR MESENTERIC ARTERIOGRAMS.  COMPARISON:  CT abdomen pelvis - 12/13/2013; nuclear medicine tagged red blood cell scan - 12/16/2013  ANESTHESIA/SEDATION: Fentanyl 50 mcg IV; Versed 1 mg IV  MEDICATIONS: None  CONTRAST:  138mL OMNIPAQUE IOHEXOL 300 MG/ML  SOLN  FLUOROSCOPY TIME:  4 minutes.  12 seconds (631.4 mGy)  COMPLICATIONS: None immediate  TECHNIQUE: Informed written consent was obtained from the patient after a discussion of the risks, benefits and alternatives to treatment. Questions regarding the procedure were encouraged and answered. A timeout was performed prior to the initiation of the procedure.  The right  groin was prepped and draped in the usual sterile fashion, and a sterile drape was applied covering the operative field. Maximum barrier sterile technique with sterile gowns and gloves were used for the procedure. A timeout was performed prior to the initiation of the procedure. Local anesthesia was provided with 1% lidocaine.  The right femoral head was marked fluoroscopically. Under ultrasound guidance, the right common femoral artery was accessed with a micropuncture kit after the overlying soft tissues were anesthetized with 1% lidocaine. An ultrasound image was saved for documentation purposes. The micropuncture sheath was exchanged for a 5 [place] vascular sheath over a Bentson wire. A closure arteriogram was performed through  the side of the sheath confirming access within the right common femoral artery.  Over a Bentson wire, a Mickelson catheter was advanced to the level of the inferior thoracic aorta where it was back bled and flushed. The catheter was then utilized to select the celiac artery and a celiac arteriogram was performed.  The Mickelson catheter was then utilized to select the superior mesenteric artery and several superior mesenteric arteriograms were performed centered over various quadrants of the abdomen.  The Mickelson catheter was then advanced cranially to re-select the celiac artery and a repeat celiac arteriogram was performed.  Finally, the [facility] catheter was again utilized select the superior mesenteric artery and a completion superior mesenteric arteriogram was performed.  All images were reviewed and the procedure was terminated. All wires catheters and sheaths were removed from the patient. Hemostasis was achieved at the right groin access site with a combination of deployment of an Exoseal closure device and manual compression. A dressing was placed. The patient tolerated the procedure well without immediate postprocedural complication.  FINDINGS: Celiac arteriogram demonstrates variant vascular anatomy with the left hepatic artery incidentally noted to arise from the left gastric artery, the gastroduodenal artery originating from the proximal aspect of the common hepatic artery as well as a prominent pancreaticoduodenal artery arising from the right hepatic artery. No definitive areas of vessel irregularity or active extravasation despite the acquisition of several selective superior mesenteric arteriograms.  Superior mesenteric arteriogram demonstrates conventional configuration. Despite the acquisition of several superior mesenteric arteriograms, no definitive areas of active extravasation or vessel irregularity were identified.  Given patient's known atherosclerotic disease (as demonstrated on  preceding abdominal CT performed 12/13/2013), and as the area of bleeding appeared to arise from the proximal small bowel on the preceding tagged red blood cell scan, the IMA was not selectively catheterized.  IMPRESSION: 1. Negative mesenteric arteriogram. No definitive areas of vessel irregularity or active extravasation. No embolization performed. 2. Incidentally noted variant vascular anatomy as above.  PLAN: Would recommend continued conservative management. As the tagged red blood cell study suggested a potential small bowel origin of the patient's GI bleed, further evaluation could be performed with capsule endoscopy as clinically indicated. Would only recommend repeat tagged red blood cell study in the setting of recurrent brisk GI bleeding and after discussion with interventional radiology, as the nuclear medicine study was only faintly positive.   Electronically Signed   By: Sandi Mariscal M.D.   On: 12/17/2013 10:21    Microbiology: No results found for this or any previous visit (from the past 240 hour(s)).   Labs: Basic Metabolic Panel:  Recent Labs Lab 12/13/13 2132 12/14/13 0920 12/15/13 0525  NA 142 142 140  K 4.2 4.2 4.3  CL 101 107 105  CO2  --  27 27  GLUCOSE 153* 142* 121*  BUN  34* 32* 20  CREATININE 0.90 0.76 0.77  CALCIUM  --  8.0* 7.9*   Liver Function Tests:  Recent Labs Lab 12/14/13 0920  AST 11  ALT 14  ALKPHOS 39  BILITOT <0.2*  PROT 5.5*  ALBUMIN 2.7*   No results for input(s): LIPASE, AMYLASE in the last 168 hours. No results for input(s): AMMONIA in the last 168 hours. CBC:  Recent Labs Lab 12/13/13 2115  12/14/13 1720 12/14/13 2114 12/15/13 0525 12/15/13 1040  12/17/13 0407  12/18/13 0342 12/18/13 1155 12/18/13 1951 12/19/13 0526 12/19/13 1146  WBC 12.3*  < > 10.1 11.9* 10.5 8.6  --  10.3  --   --   --   --   --   --   NEUTROABS 7.8*  --   --   --   --   --   --   --   --   --   --   --   --   --   HGB 12.1*  < > 9.8* 8.9* 8.6* 8.7*  <  > 9.0*  < > 9.7* 9.1* 9.3* 9.7* 9.3*  HCT 36.9*  < > 30.5* 27.5* 26.8* 26.2*  < > 27.4*  < > 30.0* 27.7* 28.2* 29.6* 28.1*  MCV 88.9  < > 88.9 88.4 89.0 91.0  --  89.5  --   --   --   --   --   --   PLT 327  < > 258 263 265 264  --  218  --   --   --   --   --   --   < > = values in this interval not displayed. Cardiac Enzymes: No results for input(s): CKTOTAL, CKMB, CKMBINDEX, TROPONINI in the last 168 hours. BNP: BNP (last 3 results) No results for input(s): PROBNP in the last 8760 hours. CBG:  Recent Labs Lab 12/18/13 1213 12/18/13 1659 12/18/13 2130 12/19/13 0802 12/19/13 1228  GLUCAP 170* 75 92 118* 118*       Signed:  Velvet Bathe  [facility] 12/19/2013, 1:37 PM

## 2013-12-19 NOTE — Progress Notes (Signed)
Discharge instructions gone over with patient. Home medications gone over. Prescriptions given. Diet, activity, and reasons to call the doctor gone over. Follow up appointment to be made if needed. Patient verbalized understanding of instructions.

## 2014-01-21 ENCOUNTER — Other Ambulatory Visit: Payer: Self-pay | Admitting: Gastroenterology

## 2014-01-21 DIAGNOSIS — R1084 Generalized abdominal pain: Secondary | ICD-10-CM

## 2014-01-26 ENCOUNTER — Ambulatory Visit
Admission: RE | Admit: 2014-01-26 | Discharge: 2014-01-26 | Disposition: A | Discharge status: Home / Self Care | Payer: Medicare Other | Source: Ambulatory Visit | Attending: Gastroenterology | Admitting: Gastroenterology

## 2014-01-26 DIAGNOSIS — R1084 Generalized abdominal pain: Secondary | ICD-10-CM

## 2014-01-26 MED ORDER — IOHEXOL 350 MG/ML SOLN
75.0000 mL | Freq: Once | INTRAVENOUS | Status: AC | PRN
  Administered 2014-01-26: 75 mL via INTRAVENOUS

## 2014-01-31 ENCOUNTER — Other Ambulatory Visit: Payer: Self-pay

## 2014-06-21 ENCOUNTER — Ambulatory Visit
Admission: RE | Admit: 2014-06-21 | Discharge: 2014-06-21 | Disposition: A | Discharge status: Home / Self Care | Payer: Medicare Other | Source: Ambulatory Visit | Attending: Pulmonary Disease | Admitting: Pulmonary Disease

## 2014-06-21 ENCOUNTER — Ambulatory Visit (INDEPENDENT_AMBULATORY_CARE_PROVIDER_SITE_OTHER): Payer: BLUE CROSS/BLUE SHIELD | Admitting: Pulmonary Disease

## 2014-06-21 ENCOUNTER — Encounter: Payer: Self-pay | Admitting: Pulmonary Disease

## 2014-06-21 VITALS — BP 144/68 | HR 82 | Ht 70.0 in | Wt 231.0 lb

## 2014-06-21 DIAGNOSIS — R0602 Shortness of breath: Secondary | ICD-10-CM | POA: Diagnosis not present

## 2014-06-21 DIAGNOSIS — J432 Centrilobular emphysema: Secondary | ICD-10-CM

## 2014-06-21 DIAGNOSIS — J9611 Chronic respiratory failure with hypoxia: Secondary | ICD-10-CM

## 2014-06-21 NOTE — Addendum Note (Signed)
Addended by: Velvet BatheAULFIELD, Yuridia Couts L on: 06/21/2014 04:56 PM   Modules accepted: Orders

## 2014-06-21 NOTE — Progress Notes (Addendum)
Subjective:    Patient ID: Daniel SorensonFrank T Norton, male    DOB: 05/12/1950, 64 y.o.   MRN: [id number]  Synopsis: 64 y/o male with severe COPD and obesity.  GOLD Grade D 2014 PFT> clear airflow obstruction> FEV1 1.10 L (31% pred), TLC 7.55 (113% pred), RV 4.65L (206% pred), DLCO 16.7 (53%pred) Started on 3L O2 with exertion 2016  HPI  Chief Complaint  Patient presents with  . Follow-up    Pt c/o sob with exertion, worsened by warm weather and pollen.  CAT score 15.     He has been doing OK, but when the pollen comes out he has problems with dyspnea.  He landed in his doctor's office with worsening bronchitis symptoms and he was treated with an antiboitic and prednisone for wheezing.  He says that these have helped.  He has been getting mucus hanging up in his throat.  Mucinex DM really helps with this.   He said that his breathing was better between the episodes, still taking symbicort 2 puffs twice a day.  [place] him have more sinus symptoms. He has been taking nasacort.    Past Medical History  Diagnosis Date  . Arthritis   . MRSA (methicillin resistant Staphylococcus aureus)   . Cellulitis of abdominal wall   . Walking pneumonia 06/29/2009  . Mass     recurrent chronically infected masses   . Diverticulitis   . Coronary artery disease   . COPD (chronic obstructive pulmonary disease)   . Smoking hx   . S/P CABG x 5   . Hypertension   . H1N1 influenza   . Pneumonia   . Complication of anesthesia     aspirated with extubation last surgery     Review of Systems  Constitutional: Positive for chills and fatigue. Negative for fever.  HENT: Negative for postnasal drip, rhinorrhea and sinus pressure.   Respiratory: Positive for shortness of breath. Negative for cough and wheezing.   Cardiovascular: Negative for chest pain, palpitations and leg swelling.       Objective:   Physical Exam Filed Vitals:   06/21/14 1335  BP: 144/68  Pulse: 82   Height: 5\' 10"  (1.778 m)  Weight: 231 lb (104.781 kg)  SpO2: 92%   RA  Gen: obese, no acute distress HEENT: NCAT, EOMi, OP clear PULM: CTA B CV: RRR, no mgr, no JVD AB: BS+, soft, nontender, no hsm MSK: warm, no edema, no clubbing, no cyanosis  December hemoglobin 9.6, none collected since     Assessment & Plan:   COPD (chronic obstructive pulmonary disease) I am concerned that Daniel Norton had an exacerbation of his COPD about a month ago. He is typically not the time of person who has exacerbations of COPD. It sounds as if this was triggered by his environmental allergies. He seems to have recovered as his lung sounds are clear today. That said, his symptoms from COPD are fairly significant and we really need to get him enrolled in a rehabilitation type program.  Plan: Continue Symbicort as prescribed In lieu of pulmonary rehabilitation (there is none available within a reasonable distance of his home) I have given him specific instructions for resistance training, sit to stand exercises, and slowly increasing walking to 35 minutes daily Follow-up 3 months or sooner if needed   Shortness of breath This is a new problem causing moderate to severe symptoms. His lungs are clear today and he is no longer  wheezing. While it may be related to his COPD I do agree with a cardiology evaluation. Intelligence because I reviewed the records from his recent hospitalization in December when he had some rectal bleeding. This seems to have subsided.   Plan: I will order a chest x-ray and ambulatory oximetry to further evaluate. Check a CBC to ensure that he is not anemic  Follow-up with cardiology   Chronic hypoxemic respiratory failure I was surprised to see that he needs oxygen on exertion. Will check CXR as above.  Plan portable oxygen concentrator for 3LqHS and with exertion.     Updated Medication List Outpatient Encounter Prescriptions as of 06/21/2014  Medication Sig  . albuterol  (PROVENTIL HFA;VENTOLIN HFA) 108 (90 BASE) MCG/ACT inhaler Inhale 2 puffs into the lungs every 6 (six) hours as needed for wheezing or shortness of breath.  Marland Kitchen amLODipine (NORVASC) 5 MG tablet Take 5 mg by mouth daily.   . budesonide-formoterol (SYMBICORT) 160-4.5 MCG/ACT inhaler Inhale 2 puffs into the lungs 2 (two) times daily.   . CRESTOR 5 MG tablet Take 5 mg by mouth at bedtime.   . ferrous sulfate 325 (65 FE) MG tablet Take 1 tablet (325 mg total) by mouth daily with breakfast.  . furosemide (LASIX) 20 MG tablet Take 40 mg by mouth daily.   . metFORMIN (GLUCOPHAGE) 500 MG tablet Take 500-1,000 mg by mouth 2 (two) times daily.  in the morning and  in the evening  . metoprolol (TOPROL-XL) 100 MG 24 hr tablet Take 100 mg by mouth every morning.   . Omega-3 Fatty Acids (FISH OIL) 1200 MG CAPS Take 2 capsules by mouth daily.   . pantoprazole (PROTONIX) 40 MG tablet Take 1 tablet (40 mg total) by mouth 2 (two) times daily.  . potassium chloride SA (K-DUR,KLOR-CON) 20 MEQ tablet Take 10 mEq by mouth daily.   Marland Kitchen Spacer/Aero-Holding Chambers (AEROCHAMBER MV) inhaler Use as instructed  . [DISCONTINUED] lisinopril (PRINIVIL,ZESTRIL) 10 MG tablet Take 20 mg by mouth every evening.    No facility-administered encounter medications on file as of 06/21/2014.

## 2014-06-21 NOTE — Assessment & Plan Note (Signed)
I was surprised to see that he needs oxygen on exertion. Will check CXR as above.  Plan portable oxygen concentrator for 3LqHS and with exertion.

## 2014-06-21 NOTE — Patient Instructions (Signed)
We will call you with the results of the chest x-ray Keep using her Symbicort as you're doing Follow-up with the heart doctor as previously arranged I would like for you to start exercising every day, start with small goals and work your way up to 35 minutes of walking daily, I also recommend that she do 3 sets of 10 sit to stand exercises as I showed you in clinic I would like to see you back in 3 months or sooner if needed

## 2014-06-21 NOTE — Addendum Note (Signed)
Addended by: Velvet BatheAULFIELD, Ismelda Weatherman L on: 06/21/2014 02:15 PM   Modules accepted: Orders

## 2014-06-21 NOTE — Assessment & Plan Note (Signed)
I am concerned that Daniel Norton had an exacerbation of his COPD about a month ago. He is typically not the time of person who has exacerbations of COPD. It sounds as if this was triggered by his environmental allergies. He seems to have recovered as his lung sounds are clear today. That said, his symptoms from COPD are fairly significant and we really need to get him enrolled in a rehabilitation type program.  Plan: Continue Symbicort as prescribed In lieu of pulmonary rehabilitation (there is none available within a reasonable distance of his home) I have given him specific instructions for resistance training, sit to stand exercises, and slowly increasing walking to 35 minutes daily Follow-up 3 months or sooner if needed

## 2014-06-21 NOTE — Assessment & Plan Note (Addendum)
This is a new problem causing moderate to severe symptoms. His lungs are clear today and he is no longer wheezing. While it may be related to his COPD I do agree with a cardiology evaluation. Intelligence because I reviewed the records from his recent hospitalization in December when he had some rectal bleeding. This seems to have subsided.   Plan: I will order a chest x-ray and ambulatory oximetry to further evaluate. Check a CBC to ensure that he is not anemic  Follow-up with cardiology

## 2014-06-24 ENCOUNTER — Telehealth: Payer: Self-pay | Admitting: Pulmonary Disease

## 2014-06-24 NOTE — Telephone Encounter (Signed)
DG Chest 2 View  Status: Finalresult Visible to patient:  Not Released Nextappt: None Dx:  SOB (shortness of breath)       Notes Recorded by Velvet Bathe, CMA on 06/23/2014 at 3:38 PM lmtcb X1 for pt. Notes Recorded by Lupita Leash, MD on 06/23/2014 at 11:51 AM A, Please let the patient know this was OK Thanks, B   Pt aware of results.  Nothing further needed.

## 2014-10-10 ENCOUNTER — Encounter: Payer: Self-pay | Admitting: Pulmonary Disease

## 2014-10-10 ENCOUNTER — Ambulatory Visit (INDEPENDENT_AMBULATORY_CARE_PROVIDER_SITE_OTHER): Payer: BLUE CROSS/BLUE SHIELD | Admitting: Pulmonary Disease

## 2014-10-10 VITALS — BP 138/74 | HR 77 | Ht 70.0 in | Wt 229.0 lb

## 2014-10-10 DIAGNOSIS — J9611 Chronic respiratory failure with hypoxia: Secondary | ICD-10-CM | POA: Diagnosis not present

## 2014-10-10 DIAGNOSIS — J432 Centrilobular emphysema: Secondary | ICD-10-CM

## 2014-10-10 DIAGNOSIS — Z23 Encounter for immunization: Secondary | ICD-10-CM | POA: Diagnosis not present

## 2014-10-10 NOTE — Patient Instructions (Signed)
Continue taking diuretic therapy as recommended by her cardiologist and her primary care physician Keep taking Symbicort twice a day no matter how you feel We will see you back in 6 months or sooner if needed

## 2014-10-10 NOTE — Progress Notes (Signed)
Subjective:    Patient ID: Daniel Norton, male    DOB: 1950/09/29, 64 y.o.   MRN: [id number]  Synopsis: 64 y/o male with severe COPD and obesity.  GOLD Grade D 2014 PFT> clear airflow obstruction> FEV1 1.10 L (31% pred), TLC 7.55 (113% pred), RV 4.65L (206% pred), DLCO 16.7 (53%pred) Started on 3L O2 with exertion 2016  HPI  Chief Complaint  Patient presents with  . Follow-up    Pt c/o stable SOB.  pt states his seasonal allergies are worsening this.     Daniel Norton has been doing well recently since taking more lasix.  He has lost a lot of weiht and has lost a lot of swelling. He is currently only taking sybmicort and his breathing is great. He is going to the doctor's office a lot for his heart ( he has "fluid around heart")  He has been taking a diuretic which helps a lot. He is taking lasix twicea day which really helps. He is not coughing much, no mucus production. No cigarettes.   He is not using oxygen any more.  He would like to get rid of it if he can.  He says that since using the diuretics he doesn't need it anymore.  Past Medical History  Diagnosis Date  . Arthritis   . MRSA (methicillin resistant Staphylococcus aureus)   . Cellulitis of abdominal wall   . Walking pneumonia 06/29/2009  . Mass     recurrent chronically infected masses   . Diverticulitis   . Coronary artery disease   . COPD (chronic obstructive pulmonary disease)   . Smoking hx   . S/P CABG x 5   . Hypertension   . H1N1 influenza   . Pneumonia   . Complication of anesthesia     aspirated with extubation last surgery     Review of Systems  Constitutional: Positive for chills and fatigue. Negative for fever.  HENT: Negative for postnasal drip, rhinorrhea and sinus pressure.   Respiratory: Positive for shortness of breath. Negative for cough and wheezing.   Cardiovascular: Negative for chest pain, palpitations and leg swelling.       Objective:   Physical Exam Filed Vitals:   10/10/14 1528   BP: 138/74  Pulse: 77  Height:  (1.778 m)  Weight: 229 lb (103.874 kg)  SpO2: 95%   RA  Gen: obese, no acute distress HEENT: NCAT, EOMi, OP clear PULM: CTA B CV: RRR, no mgr, no JVD AB: BS+, soft, nontender, no hsm MSK: warm, no edema, no clubbing, no cyanosis  June 2016 chest x-ray images personally reviewed showing emphysema but no cephalization or pulmonary edema     Assessment & Plan:   COPD (chronic obstructive pulmonary disease) This has been a stable interval for him. He has not had an exacerbation of COPD since the last visit.  Plan: Continue Symbicort twice a day Flu shot today I encouraged him to stay active and exercises much as possible  Chronic hypoxemic respiratory failure This has improved significantly since the last visit. He is no longer using oxygen when he exerts himself. His shortness of breath and hypoxemia seemed to be primarily related to pulmonary edema which interestingly was not seen on his most recent chest x-ray. However, I cannot deny the clinical improvement.  Plan: Discontinue home oxygen per his request    Updated Medication List Outpatient Encounter Prescriptions as of 10/10/2014  Medication Sig  . albuterol (PROVENTIL HFA;VENTOLIN HFA) 108 (90 BASE) MCG/ACT  inhaler Inhale 2 puffs into the lungs every 6 (six) hours as needed for wheezing or shortness of breath.  Marland Kitchen amLODipine (NORVASC) 5 MG tablet Take 5 mg by mouth daily.   . budesonide-formoterol (SYMBICORT) 160-4.5 MCG/ACT inhaler Inhale 2 puffs into the lungs 2 (two) times daily.   . CRESTOR 5 MG tablet Take 5 mg by mouth at bedtime.   . ferrous sulfate 325 (65 FE) MG tablet Take 1 tablet (325 mg total) by mouth daily with breakfast.  . furosemide (LASIX) 20 MG tablet Take 40 mg by mouth 2 (two) times daily.   Marland Kitchen lisinopril (PRINIVIL,ZESTRIL) 20 MG tablet Take 20 mg by mouth daily.  . metFORMIN (GLUCOPHAGE) 500 MG tablet Take 500-1,000 mg by mouth 2 (two) times daily.  in  the morning and  in the evening  . metolazone (ZAROXOLYN) 5 MG tablet Take 5 mg by mouth daily.  . metoprolol (TOPROL-XL) 100 MG 24 hr tablet Take 100 mg by mouth every morning.   . Omega-3 Fatty Acids (FISH OIL) 1200 MG CAPS Take 2 capsules by mouth daily.   . pantoprazole (PROTONIX) 40 MG tablet Take 1 tablet (40 mg total) by mouth 2 (two) times daily.  . potassium chloride SA (K-DUR,KLOR-CON) 20 MEQ tablet Take 10 mEq by mouth 2 (two) times daily.   Marland Kitchen Spacer/Aero-Holding Chambers (AEROCHAMBER MV) inhaler Use as instructed   No facility-administered encounter medications on file as of 10/10/2014.

## 2014-10-10 NOTE — Assessment & Plan Note (Signed)
This has improved significantly since the last visit. He is no longer using oxygen when he exerts himself. His shortness of breath and hypoxemia seemed to be primarily related to pulmonary edema which interestingly was not seen on his most recent chest x-ray. However, I cannot deny the clinical improvement.  Plan: Discontinue home oxygen per his request

## 2014-10-10 NOTE — Assessment & Plan Note (Signed)
This has been a stable interval for him. He has not had an exacerbation of COPD since the last visit.  Plan: Continue Symbicort twice a day Flu shot today I encouraged him to stay active and exercises much as possible

## 2017-02-28 ENCOUNTER — Encounter (HOSPITAL_COMMUNITY): Payer: Self-pay | Admitting: *Deleted

## 2017-02-28 ENCOUNTER — Emergency Department (HOSPITAL_COMMUNITY): Payer: Medicare Other

## 2017-02-28 ENCOUNTER — Emergency Department (HOSPITAL_COMMUNITY)
Admission: EM | Admit: 2017-02-28 | Discharge: 2017-02-28 | Disposition: A | Discharge status: Home / Self Care | Payer: Medicare Other | Attending: Emergency Medicine | Admitting: Emergency Medicine

## 2017-02-28 DIAGNOSIS — J449 Chronic obstructive pulmonary disease, unspecified: Secondary | ICD-10-CM | POA: Diagnosis not present

## 2017-02-28 DIAGNOSIS — E860 Dehydration: Secondary | ICD-10-CM | POA: Diagnosis not present

## 2017-02-28 DIAGNOSIS — Z7984 Long term (current) use of oral hypoglycemic drugs: Secondary | ICD-10-CM | POA: Diagnosis not present

## 2017-02-28 DIAGNOSIS — Z951 Presence of aortocoronary bypass graft: Secondary | ICD-10-CM | POA: Diagnosis not present

## 2017-02-28 DIAGNOSIS — E119 Type 2 diabetes mellitus without complications: Secondary | ICD-10-CM | POA: Diagnosis not present

## 2017-02-28 DIAGNOSIS — Z87891 Personal history of nicotine dependence: Secondary | ICD-10-CM | POA: Insufficient documentation

## 2017-02-28 DIAGNOSIS — I959 Hypotension, unspecified: Secondary | ICD-10-CM

## 2017-02-28 DIAGNOSIS — Z79899 Other long term (current) drug therapy: Secondary | ICD-10-CM | POA: Insufficient documentation

## 2017-02-28 DIAGNOSIS — R0602 Shortness of breath: Secondary | ICD-10-CM | POA: Insufficient documentation

## 2017-02-28 DIAGNOSIS — I10 Essential (primary) hypertension: Secondary | ICD-10-CM | POA: Insufficient documentation

## 2017-02-28 DIAGNOSIS — I2510 Atherosclerotic heart disease of native coronary artery without angina pectoris: Secondary | ICD-10-CM | POA: Insufficient documentation

## 2017-02-28 LAB — I-STAT TROPONIN, ED: Troponin i, poc: 0 ng/mL (ref 0.00–0.08)

## 2017-02-28 LAB — BASIC METABOLIC PANEL
ANION GAP: 15 (ref 5–15)
BUN: 52 mg/dL — AB (ref 6–20)
CHLORIDE: 96 mmol/L — AB (ref 101–111)
CO2: 26 mmol/L (ref 22–32)
Calcium: 9.9 mg/dL (ref 8.9–10.3)
Creatinine, Ser: 1.52 mg/dL — ABNORMAL HIGH (ref 0.61–1.24)
GFR calc Af Amer: 54 mL/min — ABNORMAL LOW (ref >60)
GFR calc non Af Amer: 47 mL/min — ABNORMAL LOW (ref >60)
Glucose, Bld: 179 mg/dL — ABNORMAL HIGH (ref 65–99)
POTASSIUM: 4.2 mmol/L (ref 3.5–5.1)
Sodium: 137 mmol/L (ref 135–145)

## 2017-02-28 LAB — CBC
HCT: 60.4 % — ABNORMAL HIGH (ref 39.0–52.0)
HEMOGLOBIN: 18.9 g/dL — AB (ref 13.0–17.0)
MCH: 27.8 pg (ref 26.0–34.0)
MCHC: 31.3 g/dL (ref 30.0–36.0)
MCV: 88.7 fL (ref 78.0–100.0)
Platelets: 251 10*3/uL (ref 150–400)
RBC: 6.81 MIL/uL — ABNORMAL HIGH (ref 4.22–5.81)
RDW: 18.2 % — AB (ref 11.5–15.5)
WBC: 10.4 10*3/uL (ref 4.0–10.5)

## 2017-02-28 MED ORDER — SODIUM CHLORIDE 0.9 % IV BOLUS (SEPSIS)
500.0000 mL | Freq: Once | INTRAVENOUS | Status: AC
  Administered 2017-02-28: 500 mL via INTRAVENOUS

## 2017-02-28 MED ORDER — IPRATROPIUM-ALBUTEROL 0.5-2.5 (3) MG/3ML IN SOLN
3.0000 mL | Freq: Once | RESPIRATORY_TRACT | Status: AC
  Administered 2017-02-28: 3 mL via RESPIRATORY_TRACT
  Filled 2017-02-28: qty 3

## 2017-02-28 NOTE — Discharge Instructions (Signed)
Hold the metolazone tomorrow.  Follow-up with Dr. Lysbeth GalasNyland on Monday.

## 2017-02-28 NOTE — ED Provider Notes (Signed)
[facility] EMERGENCY DEPARTMENT Provider Note   CSN: [id number] Arrival date & time: 02/28/17  1024     History   Chief Complaint Chief Complaint  Patient presents with  . Hypotension  . Abnormal Lab    HPI Daniel Norton is a 67 y.o. male.  HPI Patient presents with hypotension.  Seen at primary care doctor.  Has some shortness of breath.  No real cough.  No swelling in his legs.  History of COPD with chronic hypoxia in the upper 80s.  Not on oxygen.  Seen in primary care doctor yesterday and had hypotension.  Has had some recent medication changes but states it was more for his diabetes.  No fevers.  No cough.  No lightheadedness or dizziness.  States he has been eating more carefully because he was told if his sugar stayed high that he would have to start on insulin.  States he is not had very much food over the last week.  States he has been drinking water. Past Medical History:  Diagnosis Date  . Arthritis   . Cellulitis of abdominal wall   . Complication of anesthesia    aspirated with extubation last surgery  . COPD (chronic obstructive pulmonary disease) (HCC)   . Coronary artery disease   . Diverticulitis   . H1N1 influenza   . Hypertension   . Mass    recurrent chronically infected masses   . MRSA (methicillin resistant Staphylococcus aureus)   . Pneumonia   . S/P CABG x 5   . Smoking hx   . Walking pneumonia 06/29/2009    Patient Active Problem List   Diagnosis Date Noted  . Shortness of breath 06/21/2014  . Chronic hypoxemic respiratory failure (HCC) 06/21/2014  . Rectal bleeding 12/14/2013  . Diabetes mellitus without complication (HCC) 12/14/2013  . Pneumonia due to streptococcus, group A (HCC) 12/26/2010  . Acute respiratory failure (HCC) 12/24/2010  . S/P CABG x 5   . Smoking hx   . COPD (chronic obstructive pulmonary disease) (HCC)   . Coronary artery disease   . Arthritis   . Chronic wound infection of abdomen 10/22/2010     Past Surgical History:  Procedure Laterality Date  . BOWEL RESECTION  may 2010  . COLECTOMY     sigmoid colectomy  . COLONOSCOPY N/A 12/15/2013   Procedure: COLONOSCOPY;  Surgeon: Graylin Shiver, MD;  Location: [facility] ENDOSCOPY;  Service: Endoscopy;  Laterality: N/A;  . COLOSTOMY    . COLOSTOMY TAKEDOWN    . CORONARY ARTERY BYPASS GRAFT    . ENTEROCUTANEOUS FISTULA CLOSURE  03/16/2009  . ESOPHAGOGASTRODUODENOSCOPY N/A 12/14/2013   Procedure: ESOPHAGOGASTRODUODENOSCOPY (EGD);  Surgeon: Graylin Shiver, MD;  Location: [facility] ENDOSCOPY;  Service: Endoscopy;  Laterality: N/A;  . HERNIA REPAIR     ventral hernia        Home Medications    Prior to Admission medications   Medication Sig Start Date End Date Taking? Authorizing Provider  acetaminophen (TYLENOL 8 HOUR ARTHRITIS PAIN) 650 MG CR tablet Take 1,300 mg by mouth every 8 (eight) hours as needed for pain.   Yes [provider]  albuterol (PROVENTIL HFA;VENTOLIN HFA) 108 (90 BASE) MCG/ACT inhaler Inhale 2 puffs into the lungs every 6 (six) hours as needed for wheezing or shortness of breath. 04/21/13  Yes Lupita Leash, MD  amLODipine (NORVASC) 5 MG tablet Take 5 mg by mouth daily.  09/12/10  Yes [provider]  Ascorbic Acid (VITAMIN C)  1000 MG tablet Take 1,000 mg by mouth daily.   Yes [provider]  atorvastatin (LIPITOR) 10 MG tablet Take 10 mg by mouth daily. 11/08/16 11/08/17 Yes [provider]  budesonide-formoterol (SYMBICORT) 160-4.5 MCG/ACT inhaler Inhale 2 puffs into the lungs 2 (two) times daily.    Yes [provider]  dextromethorphan-guaiFENesin (MUCINEX DM) 30-600 MG 12hr tablet Take 1 tablet by mouth 2 (two) times daily.   Yes [provider]  FARXIGA 10 MG TABS tablet Take 10 mg by mouth daily. 02/10/17  Yes [provider]  fluticasone (FLONASE) 50 MCG/ACT nasal spray Place 1 spray into both nostrils daily as needed for congestion. 12/22/10  Yes [provider]  furosemide (LASIX) 20 MG tablet Take 40 mg by mouth daily.    Yes [provider]  gabapentin (NEURONTIN) 300 MG capsule Take 300 mg by mouth at bedtime. 05/22/16  Yes [provider]  glipiZIDE (GLUCOTROL XL) 10 MG 24 hr tablet Take 10 mg by mouth 2 (two) times daily. 01/27/17  Yes [provider]  ipratropium-albuterol (DUONEB) 0.5-2.5 (3) MG/3ML SOLN Inhale 3 mLs into the lungs 4 (four) times daily as needed for shortness of breath or wheezing. 01/13/17  Yes [provider]  lisinopril (PRINIVIL,ZESTRIL) 10 MG tablet Take 10 mg by mouth daily.    Yes [provider]  metFORMIN (GLUCOPHAGE-XR) 500 MG 24 hr tablet Take 500 mg by mouth daily. 05/22/16  Yes [provider]  metolazone (ZAROXOLYN) 5 MG tablet Take 5 mg by mouth daily.   Yes [provider]  metoprolol (TOPROL-XL) 100 MG 24 hr tablet Take 100 mg by mouth every morning.  09/02/10  Yes [provider]  Omega-3 Fatty Acids (FISH OIL) 1200 MG CAPS Take 2 capsules by mouth daily.    Yes [provider]  potassium chloride SA (K-DUR,KLOR-CON) 20 MEQ tablet Take 10 mEq by mouth 2 (two) times daily.    Yes [provider]  repaglinide (PRANDIN) 2 MG tablet Take 2 mg by mouth 3 (three) times daily before meals.   Yes [provider]  ferrous sulfate 325 (65 FE) MG tablet Take 1 tablet (325 mg total) by mouth daily with breakfast. Patient not taking: Reported on 02/28/2017 12/19/13   Penny PiaVega, [place], MD  pantoprazole (PROTONIX) 40 MG tablet Take 1 tablet (40 mg total) by mouth 2 (two) times daily. Patient not taking: Reported on 02/28/2017 12/19/13   Penny PiaVega, [place], MD  Spacer/Aero-Holding Chambers (AEROCHAMBER MV) inhaler Use as instructed 04/21/13   Lupita LeashMcQuaid, Douglas B, MD    Family History Family History  Problem Relation Age of Onset  . Emphysema Father   . Allergies Mother   . Allergies Maternal Grandmother     Social History Social  History   Tobacco Use  . Smoking status: Former Smoker    Packs/day: 2.00    Years: 38.00    Pack years: 76.00    Types: Cigarettes    Last attempt to quit: 04/22/2002    Years since quitting: 14.8  . Smokeless tobacco: Never Used  Substance Use Topics  . Alcohol use: No  . Drug use: No     Allergies   Doxycycline; Morphine and related; and Penicillins   Review of Systems Review of Systems  Constitutional: Negative for appetite change.  HENT: Negative for congestion.   Respiratory: Positive for cough and shortness of breath.   Cardiovascular: Negative for chest pain and leg swelling.  Gastrointestinal: Negative for abdominal  pain.  Genitourinary: Negative for flank pain.  Musculoskeletal: Negative for back pain.  Neurological: Negative for speech difficulty and numbness.  Hematological: Negative for adenopathy.  Psychiatric/Behavioral: Negative for confusion.     Physical Exam Updated Vital Signs BP 112/72   Pulse 80   Temp 98.4 F (36.9 C) (Oral)   Resp (!) 21   SpO2 92%   Physical Exam  Constitutional: He appears well-developed.  HENT:  Head: Atraumatic.  Neck: Neck supple.  Cardiovascular: Normal rate.  Pulmonary/Chest:  Rales bilateral bases with harsh breath sounds.  Abdominal: There is no tenderness.  Musculoskeletal: He exhibits no tenderness.  Neurological: He is alert.  Skin: Skin is warm. Capillary refill takes less than 2 seconds.  Psychiatric: He has a normal mood and affect.     ED Treatments / Results  Labs (all labs ordered are listed, but only abnormal results are displayed) Labs Reviewed  BASIC METABOLIC PANEL - Abnormal; Notable for the following components:      Result Value   Chloride 96 (*)    Glucose, Bld 179 (*)    BUN 52 (*)    Creatinine, Ser 1.52 (*)    GFR calc non Af Amer 47 (*)    GFR calc Af Amer 54 (*)    All other components within normal limits  CBC - Abnormal; Notable for the following components:   RBC 6.81  (*)    Hemoglobin 18.9 (*)    HCT 60.4 (*)    RDW 18.2 (*)    All other components within normal limits  I-STAT TROPONIN, ED    EKG  EKG Interpretation  Date/Time:  Friday February 28 2017 10:35:54 EST Ventricular Rate:  94 PR Interval:  150 QRS Duration: 88 QT Interval:  378 QTC Calculation: 472 R Axis:   -132 Text Interpretation:  Sinus rhythm with occasional Premature ventricular complexes Right superior axis deviation Pulmonary disease pattern Abnormal ECG Confirmed by Benjiman Core [phone]) on 02/28/2017 1:22:34 PM       Radiology Dg Chest 2 View  Result Date: 02/28/2017 CLINICAL DATA:  Mid chest pain, onset 2 days ago EXAM: CHEST  2 VIEW COMPARISON:  06/21/2014 FINDINGS: Prior CABG. Heart is borderline in size. No effusions. No acute bony abnormality. IMPRESSION: No active cardiopulmonary disease. Electronically Signed   By: Charlett Nose M.D.   On: 02/28/2017 10:59    Procedures Procedures (including critical care time)  Medications Ordered in ED Medications  sodium chloride 0.9 % bolus 500 mL (0 mLs Intravenous Stopped 02/28/17 1435)  ipratropium-albuterol (DUONEB) 0.5-2.5 (3) MG/3ML nebulizer solution 3 mL (3 mLs Nebulization Given 02/28/17 1315)     Initial Impression / Assessment and Plan / ED Course  I have reviewed the triage vital signs and the nursing notes.  Pertinent labs & imaging results that were available during my care of the patient were reviewed by me and considered in my medical decision making (see chart for details).     Patient with hypotension.  Had lab work from yesterday showed worsening renal function of the since normalized.  Has had decreased oral intake since being told he needed to keep his sugars down.  Likely has component dehydration.  Blood pressures come up.  Patient feels better.  Will increase his oral intake somewhat.  Will skip 1 of his fluid pills tomorrow.  Follow-up with his primary care doctor.  Final Clinical  Impressions(s) / ED Diagnoses   Final diagnoses:  Hypotension, unspecified hypotension type  Dehydration  ED Discharge Orders    None       Benjiman Core, MD 02/28/17 2231

## 2017-02-28 NOTE — ED Triage Notes (Signed)
Pt sent from [facility], pt c/o SOB, pt reports mid cp onset x 2 days ago, pt reported to be hypotensive at PCP office, pt O2 88% on RA in triage, pt placed on 2 L [place], pt denies n/v/d, pt A&O x4

## 2017-02-28 NOTE — ED Notes (Signed)
Pt reports he was instructed to come to the ED d/t his kidney function and hypotension.  Pt is A&Ox 4.  Denies any pain but reports SOB which is not new.  Pt has hx of COPD. Pt's wife is at bedside.

## 2017-09-23 ENCOUNTER — Telehealth: Payer: Self-pay | Admitting: Internal Medicine

## 2017-09-23 NOTE — Telephone Encounter (Signed)
Pt is calling back [phone]

## 2017-09-23 NOTE — Telephone Encounter (Signed)
Patient currently has an appt to see Dr. Maple Hudson on 09/24/17 to address his daytime sleepiness. Per the appointment note, he is already on a cpap machine and stated that [facility] did his sleep study.   I called the sleep lab to see if they had a copy of patient's SS, they did not.   Left message for patient to confirm that he is actually on a cpap machine and where the SS was done. Will keep this encounter open in case he calls back.

## 2017-09-23 NOTE — Telephone Encounter (Signed)
Noted. Will close this encounter.  

## 2017-09-23 NOTE — Telephone Encounter (Signed)
Spoke with pt, he had to cancel his sleep test because [place]. He never had a sleep study done and he is not on CPAP. FYI Cherina

## 2017-09-24 ENCOUNTER — Ambulatory Visit: Payer: Medicare Other | Admitting: Internal Medicine

## 2018-10-08 ENCOUNTER — Other Ambulatory Visit: Payer: Self-pay

## 2018-10-08 ENCOUNTER — Ambulatory Visit (HOSPITAL_COMMUNITY)
Admission: RE | Admit: 2018-10-08 | Discharge: 2018-10-08 | Disposition: A | Discharge status: Home / Self Care | Payer: Medicare Other | Source: Ambulatory Visit | Attending: General Surgery | Admitting: General Surgery

## 2018-10-08 ENCOUNTER — Other Ambulatory Visit: Payer: Self-pay | Admitting: General Surgery

## 2018-10-08 ENCOUNTER — Other Ambulatory Visit (HOSPITAL_COMMUNITY): Payer: Self-pay | Admitting: General Surgery

## 2018-10-08 DIAGNOSIS — R1030 Lower abdominal pain, unspecified: Secondary | ICD-10-CM

## 2018-10-13 ENCOUNTER — Other Ambulatory Visit: Payer: Self-pay | Admitting: General Surgery

## 2018-10-13 DIAGNOSIS — R1030 Lower abdominal pain, unspecified: Secondary | ICD-10-CM
